# Patient Record
Sex: Male | Born: 1995 | Race: Black or African American | Hispanic: No | Marital: Single | Smoking: Current every day smoker
Health system: Southern US, Community
[De-identification: ages and names within clinical notes are randomized; demographics above are authoritative.]

---

## 2013-02-09 ENCOUNTER — Encounter (HOSPITAL_BASED_OUTPATIENT_CLINIC_OR_DEPARTMENT_OTHER): Payer: Self-pay | Admitting: *Deleted

## 2013-02-09 ENCOUNTER — Emergency Department (HOSPITAL_BASED_OUTPATIENT_CLINIC_OR_DEPARTMENT_OTHER)
Admission: EM | Admit: 2013-02-09 | Discharge: 2013-02-09 | Disposition: A | Discharge status: Home / Self Care | Payer: Managed Care, Other (non HMO) | Attending: Emergency Medicine | Admitting: Emergency Medicine

## 2013-02-09 DIAGNOSIS — H5710 Ocular pain, unspecified eye: Secondary | ICD-10-CM | POA: Insufficient documentation

## 2013-02-09 DIAGNOSIS — J3489 Other specified disorders of nose and nasal sinuses: Secondary | ICD-10-CM | POA: Insufficient documentation

## 2013-02-09 DIAGNOSIS — H579 Unspecified disorder of eye and adnexa: Secondary | ICD-10-CM | POA: Insufficient documentation

## 2013-02-09 DIAGNOSIS — R6889 Other general symptoms and signs: Secondary | ICD-10-CM | POA: Insufficient documentation

## 2013-02-09 DIAGNOSIS — H109 Unspecified conjunctivitis: Secondary | ICD-10-CM

## 2013-02-09 DIAGNOSIS — H53149 Visual discomfort, unspecified: Secondary | ICD-10-CM | POA: Insufficient documentation

## 2013-02-09 DIAGNOSIS — F17200 Nicotine dependence, unspecified, uncomplicated: Secondary | ICD-10-CM | POA: Insufficient documentation

## 2013-02-09 MED ORDER — FLUORESCEIN SODIUM 1 MG OP STRP
ORAL_STRIP | OPHTHALMIC | Status: AC
  Administered 2013-02-09: 18:00:00
  Filled 2013-02-09: qty 1

## 2013-02-09 MED ORDER — ERYTHROMYCIN 5 MG/GM OP OINT: TOPICAL_OINTMENT | Freq: Two times a day (BID) | OPHTHALMIC | Status: DC

## 2013-02-09 MED ORDER — TETRACAINE HCL 0.5 % OP SOLN
OPHTHALMIC | Status: AC
  Administered 2013-02-09: 18:00:00
  Filled 2013-02-09: qty 2

## 2013-02-09 NOTE — ED Notes (Signed)
Left eye is light sensitive, red, tearing and painful. Started 2 days ago.

## 2013-02-09 NOTE — ED Provider Notes (Signed)
Patient seen/examined in the Emergency Department in conjunction with Resident Physician Provider [facility] Patient reports left eye redness/pain Exam : OS conjunctival injection, +consensual pain but no cell/flare, IOP less than 20.  No foreign body/abrasions noted Plan: f/u with ophtho.  Possible early iritis but visual acuity normal without much photophobia noted   Joya Gaskins, MD 02/09/13 1850

## 2013-02-09 NOTE — ED Provider Notes (Signed)
I have personally seen and examined the patient.  I have discussed the plan of care with the resident.  I have reviewed the documentation on PMH/FH/Soc. History.  I have reviewed the documentation of the resident and agree.   Joya Gaskins, MD 02/09/13 2214

## 2013-02-09 NOTE — ED Provider Notes (Signed)
History     CSN: [id number]  Arrival date & time 02/09/13  4098   First MD Initiated Contact with Patient 02/09/13 1737      Chief Complaint  Patient presents with  . Eye Problem    (Consider location/radiation/quality/duration/timing/severity/associated sxs/prior treatment) Patient is a 17 y.o. male presenting with eye problem.  Eye Problem Associated symptoms: itching, photophobia and redness   Associated symptoms: no discharge and no nausea     17 year old male presents with left eye redness, photophobia, and pain since yesterday, was acutely worse today.  He says he does not wear contacts or glasses, has not scratched his eye that he knows of.  He says when he left work yesterday afternoon the sun really hurt his left eye.  He says it seems like there is a film over his eye but denies spots in his vision.  He says that even when the left eye is covered if he sees bright light with the right the left hurts.    He does have allergic rhinitis and gets red itchy eyes during the spring time, but it is usually both eyes and the pain has never been this bad before.   History reviewed. No pertinent past medical history.  History reviewed. No pertinent past surgical history.  No family history on file.  History  Substance Use Topics  . Smoking status: Current Every Day Smoker -- 0.50 packs/day    Types: Cigarettes  . Smokeless tobacco: Not on file  . Alcohol Use: Yes      Review of Systems  Constitutional: Negative for fever.  HENT: Positive for rhinorrhea and sneezing.   Eyes: Positive for photophobia, pain, redness and itching. Negative for discharge and visual disturbance.  Respiratory: Negative for shortness of breath.   Cardiovascular: Negative for chest pain.  Gastrointestinal: Negative for nausea.  Endocrine: Negative for polyuria.  Genitourinary: Negative for dysuria.  Musculoskeletal: Negative for myalgias.  Skin: Negative for rash.  Neurological: Negative for  light-headedness.    Allergies  Review of patient's allergies indicates no known allergies.  Home Medications  No current outpatient prescriptions on file.  BP 120/64  Pulse 60  Temp(Src) 98.6 F (37 C) (Oral)  Resp 16  Ht 5\' 11"  (1.803 m)  Wt 205 lb (92.987 kg)  BMI 28.6 kg/m2  SpO2 100%  Physical Exam  Constitutional: He appears well-developed and well-nourished.  Eyes: Right eye exhibits no discharge. Left eye exhibits no discharge, no exudate and no hordeolum. No foreign body present in the left eye. Right conjunctiva is not injected. Left conjunctiva is injected.  Slit lamp exam:      The right eye shows no corneal abrasion and no corneal flare.       The left eye shows no corneal abrasion and no corneal flare.    ED Course  Procedures (including critical care time)  Labs Reviewed - No data to display No results found.   1. Conjunctivitis       MDM  No sign of corneal abrasion or iritis on exam today.  rx for erythromycin and advised to f/u with opthamology if not improved in 1-2 days.         Ardyth Gal, MD 02/09/13 1842  Ardyth Gal, MD 02/09/13 2136

## 2014-04-10 ENCOUNTER — Encounter (HOSPITAL_COMMUNITY): Payer: Self-pay | Admitting: Emergency Medicine

## 2014-04-10 ENCOUNTER — Emergency Department (HOSPITAL_COMMUNITY): Payer: Managed Care, Other (non HMO)

## 2014-04-10 ENCOUNTER — Emergency Department (HOSPITAL_COMMUNITY)
Admission: EM | Admit: 2014-04-10 | Discharge: 2014-04-10 | Disposition: A | Discharge status: Home / Self Care | Payer: Managed Care, Other (non HMO) | Attending: Emergency Medicine | Admitting: Emergency Medicine

## 2014-04-10 DIAGNOSIS — S0120XA Unspecified open wound of nose, initial encounter: Secondary | ICD-10-CM | POA: Insufficient documentation

## 2014-04-10 DIAGNOSIS — S0012XA Contusion of left eyelid and periocular area, initial encounter: Secondary | ICD-10-CM

## 2014-04-10 DIAGNOSIS — S0121XA Laceration without foreign body of nose, initial encounter: Secondary | ICD-10-CM

## 2014-04-10 DIAGNOSIS — S0010XA Contusion of unspecified eyelid and periocular area, initial encounter: Secondary | ICD-10-CM | POA: Insufficient documentation

## 2014-04-10 DIAGNOSIS — S022XXA Fracture of nasal bones, initial encounter for closed fracture: Secondary | ICD-10-CM | POA: Insufficient documentation

## 2014-04-10 DIAGNOSIS — Z23 Encounter for immunization: Secondary | ICD-10-CM | POA: Insufficient documentation

## 2014-04-10 DIAGNOSIS — F17200 Nicotine dependence, unspecified, uncomplicated: Secondary | ICD-10-CM | POA: Insufficient documentation

## 2014-04-10 MED ORDER — HYDROCODONE-ACETAMINOPHEN 5-325 MG PO TABS
2.0000 | ORAL_TABLET | Freq: Once | ORAL | Status: AC
  Administered 2014-04-10: 2 via ORAL
  Filled 2014-04-10: qty 2

## 2014-04-10 MED ORDER — HYDROCODONE-ACETAMINOPHEN 5-325 MG PO TABS: 1.0000 | ORAL_TABLET | ORAL | Status: DC | PRN

## 2014-04-10 MED ORDER — FLUORESCEIN SODIUM 1 MG OP STRP
1.0000 | ORAL_STRIP | Freq: Once | OPHTHALMIC | Status: AC
  Administered 2014-04-10: 1 via OPHTHALMIC
  Filled 2014-04-10: qty 1

## 2014-04-10 MED ORDER — TETRACAINE HCL 0.5 % OP SOLN
1.0000 [drp] | Freq: Once | OPHTHALMIC | Status: AC
  Administered 2014-04-10: 1 [drp] via OPHTHALMIC
  Filled 2014-04-10: qty 2

## 2014-04-10 MED ORDER — TETANUS-DIPHTH-ACELL PERTUSSIS 5-2.5-18.5 LF-MCG/0.5 IM SUSP
0.5000 mL | Freq: Once | INTRAMUSCULAR | Status: DC
  Filled 2014-04-10: qty 0.5

## 2014-04-10 NOTE — Discharge Instructions (Signed)
Contusion °A contusion is a deep bruise. Contusions are the result of an injury that caused bleeding under the skin. The contusion may turn blue, purple, or yellow. Minor injuries will give you a painless contusion, but more severe contusions may stay painful and swollen for a few weeks.  °CAUSES  °A contusion is usually caused by a blow, trauma, or direct force to an area of the body. °SYMPTOMS  °· Swelling and redness of the injured area. °· Bruising of the injured area. °· Tenderness and soreness of the injured area. °· Pain. °DIAGNOSIS  °The diagnosis can be made by taking a history and physical exam. An X-ray, CT scan, or MRI may be needed to determine if there were any associated injuries, such as fractures. °TREATMENT  °Specific treatment will depend on what area of the body was injured. In general, the best treatment for a contusion is resting, icing, elevating, and applying cold compresses to the injured area. Over-the-counter medicines may also be recommended for pain control. Ask your caregiver what the best treatment is for your contusion. °HOME CARE INSTRUCTIONS  °· Put ice on the injured area. °· Put ice in a plastic bag. °· Place a towel between your skin and the bag. °· Leave the ice on for 15-20 minutes, 3-4 times a day, or as directed by your health care provider. °· Only take over-the-counter or prescription medicines for pain, discomfort, or fever as directed by your caregiver. Your caregiver may recommend avoiding anti-inflammatory medicines (aspirin, ibuprofen, and naproxen) for 48 hours because these medicines may increase bruising. °· Rest the injured area. °· If possible, elevate the injured area to reduce swelling. °SEEK IMMEDIATE MEDICAL CARE IF:  °· You have increased bruising or swelling. °· You have pain that is getting worse. °· Your swelling or pain is not relieved with medicines. °MAKE SURE YOU:  °· Understand these instructions. °· Will watch your condition. °· Will get help right  away if you are not doing well or get worse. °Document Released: 07/10/2005 Document Revised: 10/05/2013 Document Reviewed: 08/05/2011 °Exit[employer]® Patient Information ©2015 [employer]. This information is not intended to replace advice given to you by your health care provider. Make sure you discuss any questions you have with your health care provider. ° °Laceration Care, Adult °A laceration is a cut or lesion that goes through all layers of the skin and into the tissue just beneath the skin. °TREATMENT  °Some lacerations may not require closure. Some lacerations may not be able to be closed due to an increased risk of infection. It is important to see your caregiver as soon as possible after an injury to minimize the risk of infection and maximize the opportunity for successful closure. °If closure is appropriate, pain medicines may be given, if needed. The wound will be cleaned to help prevent infection. Your caregiver will use stitches (sutures), staples, wound glue (adhesive), or skin adhesive strips to repair the laceration. These tools bring the skin edges together to allow for faster healing and a better cosmetic outcome. However, all wounds will heal with a scar. Once the wound has healed, scarring can be minimized by covering the wound with sunscreen during the day for 1 full year. °HOME CARE INSTRUCTIONS  °For sutures or staples: °· Keep the wound clean and dry. °· If you were given a bandage (dressing), you should change it at least once a day. Also, change the dressing if it becomes wet or dirty, or as directed by your caregiver. °·   Wash the wound with soap and water 2 times a day. Rinse the wound off with water to remove all soap. Pat the wound dry with a clean towel. °· After cleaning, apply a thin layer of the antibiotic ointment as recommended by your caregiver. This will help prevent infection and keep the dressing from sticking. °· You may shower as usual after the first 24 hours. Do not soak  the wound in water until the sutures are removed. °· Only take over-the-counter or prescription medicines for pain, discomfort, or fever as directed by your caregiver. °· Get your sutures or staples removed as directed by your caregiver. °For skin adhesive strips: °· Keep the wound clean and dry. °· Do not get the skin adhesive strips wet. You may bathe carefully, using caution to keep the wound dry. °· If the wound gets wet, pat it dry with a clean towel. °· Skin adhesive strips will fall off on their own. You may trim the strips as the wound heals. Do not remove skin adhesive strips that are still stuck to the wound. They will fall off in time. °For wound adhesive: °· You may briefly wet your wound in the shower or bath. Do not soak or scrub the wound. Do not swim. Avoid periods of heavy perspiration until the skin adhesive has fallen off on its own. After showering or bathing, gently pat the wound dry with a clean towel. °· Do not apply liquid medicine, cream medicine, or ointment medicine to your wound while the skin adhesive is in place. This may loosen the film before your wound is healed. °· If a dressing is placed over the wound, be careful not to apply tape directly over the skin adhesive. This may cause the adhesive to be pulled off before the wound is healed. °· Avoid prolonged exposure to sunlight or tanning lamps while the skin adhesive is in place. Exposure to ultraviolet light in the first year will darken the scar. °· The skin adhesive will usually remain in place for 5 to 10 days, then naturally fall off the skin. Do not pick at the adhesive film. °You may need a tetanus shot if: °· You cannot remember when you had your last tetanus shot. °· You have never had a tetanus shot. °If you get a tetanus shot, your arm may swell, get red, and feel warm to the touch. This is common and not a problem. If you need a tetanus shot and you choose not to have one, there is a rare chance of getting tetanus.  Sickness from tetanus can be serious. °SEEK MEDICAL CARE IF:  °· You have redness, swelling, or increasing pain in the wound. °· You see a red line that goes away from the wound. °· You have yellowish-white fluid (pus) coming from the wound. °· You have a fever. °· You notice a bad smell coming from the wound or dressing. °· Your wound breaks open before or after sutures have been removed. °· You notice something coming out of the wound such as wood or glass. °· Your wound is on your hand or foot and you cannot move a finger or toe. °SEEK IMMEDIATE MEDICAL CARE IF:  °· Your pain is not controlled with prescribed medicine. °· You have severe swelling around the wound causing pain and numbness or a change in color in your arm, hand, leg, or foot. °· Your wound splits open and starts bleeding. °· You have worsening numbness, weakness, or loss of function of any   joint around or beyond the wound. °· You develop painful lumps near the wound or on the skin anywhere on your body. °MAKE SURE YOU:  °· Understand these instructions. °· Will watch your condition. °· Will get help right away if you are not doing well or get worse. °Document Released: 09/30/2005 Document Revised: 12/23/2011 Document Reviewed: 03/26/2011 °ExitCare® Patient Information ©2015 [employer]. This information is not intended to replace advice given to you by your health care provider. Make sure you discuss any questions you have with your health care provider. ° °
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
## 2014-04-11 NOTE — ED Provider Notes (Signed)
Medical screening examination/treatment/procedure(s) were performed by non-physician practitioner and as supervising physician I was immediately available for consultation/collaboration.   EKG Interpretation None        Enid SkeensJoshua M Zavitz, MD 04/11/14 [phone]

## 2019-02-27 ENCOUNTER — Emergency Department (HOSPITAL_COMMUNITY): Payer: 59

## 2019-02-27 ENCOUNTER — Emergency Department (HOSPITAL_COMMUNITY)
Admission: EM | Admit: 2019-02-27 | Discharge: 2019-02-27 | Disposition: A | Discharge status: Home / Self Care | Payer: 59 | Attending: Emergency Medicine | Admitting: Emergency Medicine

## 2019-02-27 ENCOUNTER — Encounter (HOSPITAL_COMMUNITY): Payer: Self-pay | Admitting: Emergency Medicine

## 2019-02-27 ENCOUNTER — Other Ambulatory Visit: Payer: Self-pay

## 2019-02-27 ENCOUNTER — Ambulatory Visit (HOSPITAL_COMMUNITY)
Admission: EM | Admit: 2019-02-27 | Discharge: 2019-02-27 | Disposition: A | Discharge status: Home / Self Care | Payer: 59 | Source: Home / Self Care | Attending: Family Medicine | Admitting: Family Medicine

## 2019-02-27 DIAGNOSIS — R112 Nausea with vomiting, unspecified: Secondary | ICD-10-CM | POA: Insufficient documentation

## 2019-02-27 DIAGNOSIS — F17210 Nicotine dependence, cigarettes, uncomplicated: Secondary | ICD-10-CM | POA: Diagnosis not present

## 2019-02-27 DIAGNOSIS — R1013 Epigastric pain: Secondary | ICD-10-CM | POA: Diagnosis not present

## 2019-02-27 LAB — CBC
HCT: 50.6 % (ref 39.0–52.0)
Hemoglobin: 17.7 g/dL — ABNORMAL HIGH (ref 13.0–17.0)
MCH: 31.2 pg (ref 26.0–34.0)
MCHC: 35 g/dL (ref 30.0–36.0)
MCV: 89.1 fL (ref 80.0–100.0)
Platelets: 320 10*3/uL (ref 150–400)
RBC: 5.68 MIL/uL (ref 4.22–5.81)
RDW: 12.6 % (ref 11.5–15.5)
WBC: 11.8 10*3/uL — ABNORMAL HIGH (ref 4.0–10.5)
nRBC: 0 % (ref 0.0–0.2)

## 2019-02-27 LAB — COMPREHENSIVE METABOLIC PANEL
ALT: 42 U/L (ref 0–44)
AST: 26 U/L (ref 15–41)
Albumin: 4.9 g/dL (ref 3.5–5.0)
Alkaline Phosphatase: 82 U/L (ref 38–126)
Anion gap: 14 (ref 5–15)
BUN: 8 mg/dL (ref 6–20)
CO2: 23 mmol/L (ref 22–32)
Calcium: 10.1 mg/dL (ref 8.9–10.3)
Chloride: 101 mmol/L (ref 98–111)
Creatinine, Ser: 1.17 mg/dL (ref 0.61–1.24)
GFR calc Af Amer: >60 mL/min (ref >60)
GFR calc non Af Amer: >60 mL/min (ref >60)
Glucose, Bld: 95 mg/dL (ref 70–99)
Potassium: 3.2 mmol/L — ABNORMAL LOW (ref 3.5–5.1)
Sodium: 138 mmol/L (ref 135–145)
Total Bilirubin: 1.2 mg/dL (ref 0.3–1.2)
Total Protein: 9.2 g/dL — ABNORMAL HIGH (ref 6.5–8.1)

## 2019-02-27 LAB — RAPID URINE DRUG SCREEN, HOSP PERFORMED
Amphetamines: NOT DETECTED
Barbiturates: NOT DETECTED
Benzodiazepines: NOT DETECTED
Cocaine: POSITIVE — AB
Opiates: NOT DETECTED
Tetrahydrocannabinol: POSITIVE — AB

## 2019-02-27 LAB — LIPASE, BLOOD: Lipase: 28 U/L (ref 11–51)

## 2019-02-27 MED ORDER — ONDANSETRON HCL 4 MG/2ML IJ SOLN
4.0000 mg | Freq: Once | INTRAMUSCULAR | Status: AC
  Administered 2019-02-27: 4 mg via INTRAVENOUS
  Filled 2019-02-27: qty 2

## 2019-02-27 MED ORDER — HYDROMORPHONE HCL 1 MG/ML IJ SOLN: 0.5000 mg | Freq: Once | INTRAMUSCULAR | Status: DC

## 2019-02-27 MED ORDER — ONDANSETRON 4 MG PO TBDP: 4.0000 mg | ORAL_TABLET | Freq: Three times a day (TID) | ORAL | 0 refills | Status: DC | PRN

## 2019-02-27 MED ORDER — SODIUM CHLORIDE 0.9 % IV BOLUS
1000.0000 mL | Freq: Once | INTRAVENOUS | Status: AC
  Administered 2019-02-27: 1000 mL via INTRAVENOUS

## 2019-02-27 MED ORDER — LORAZEPAM 2 MG/ML IJ SOLN: 1.0000 mg | Freq: Once | INTRAMUSCULAR | Status: DC

## 2019-02-27 MED ORDER — POTASSIUM CHLORIDE CRYS ER 20 MEQ PO TBCR
40.0000 meq | EXTENDED_RELEASE_TABLET | Freq: Once | ORAL | Status: AC
  Administered 2019-02-27: 40 meq via ORAL
  Filled 2019-02-27: qty 2

## 2019-02-27 MED ORDER — FAMOTIDINE IN NACL 20-0.9 MG/50ML-% IV SOLN
20.0000 mg | Freq: Once | INTRAVENOUS | Status: AC
  Administered 2019-02-27: 20 mg via INTRAVENOUS
  Filled 2019-02-27: qty 50

## 2019-02-27 NOTE — ED Notes (Signed)
Pt obreathing fast and c/o drawing hands. Pt cautioned too slow breathing. Same reported to Dr. Denton Lank.

## 2019-02-27 NOTE — ED Notes (Addendum)
Pt resting much mnore comfortabluy and sleeping at intervals. Will inform provider. Pt sleeping at intervals and states pain improved.

## 2019-02-27 NOTE — ED Triage Notes (Signed)
Pt arrives via POV with reports of emesis since yesterday. Denies any diarrhea or abd pain.

## 2019-02-27 NOTE — ED Provider Notes (Signed)
  Physical Exam  BP 131/71 (BP Location: Right Arm)   Pulse 72   Temp 99 F (37.2 C) (Oral)   Resp (!) 21   Ht 5\' 11"  (1.803 m)   Wt 108.9 kg   SpO2 100%   BMI 33.47 kg/m   Physical Exam  ED Course/Procedures     Procedures  MDM  Received care of patient from Dr. Ethelle Lyon at 3 PM.  Please see his note for prior history and physical exam.  Briefly this is a 23 year old male who presents with concern for nausea and vomiting beginning yesterday.  On my history, he reports vomiting without abdominal pain, no diarrhea, no dysuria, but does report shortness of breath, chills, headache and sore throat.  He has no sick contacts.  CXR ordered and negative.  Reports sore throat from vomiting on reeval.   Abdominal exam benign, tolerating po, well appearing.   Discussed suspect viral gastroenteritis, effect of marijuana and also consider COVID19 given constellation of symptoms.  He declines testing but reports he will quarantine given the potential clinical concern.  Given rx for zofran. Patient discharged in stable condition with understanding of reasons to return.     Alvira Monday, MD 03/01/19 1436

## 2019-02-27 NOTE — Discharge Instructions (Addendum)
It was our pleasure to provide your ER care today - we hope that you feel better.  Drink plenty of fluids.   Follow up with primary care doctor Monday if symptoms fail to improve/resolve.  Return to ER if worse, new symptoms, fevers, new or severe pain, persistent vomiting, other concern.

## 2019-02-27 NOTE — ED Notes (Signed)
Pt aware of need for urine sample, urinal at bedside 
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
## 2019-02-27 NOTE — ED Notes (Signed)
Reminded pt of need for urine sample, given privacy to attempt to provide

## 2019-02-27 NOTE — ED Provider Notes (Signed)
[facility] EMERGENCY DEPARTMENT Provider Note   CSN: [id number] Arrival date & time: 02/27/19  1322    History   Chief Complaint Chief Complaint  Patient presents with  . Emesis    HPI Calogero Boose is a 23 y.o. male.     Patient c/o nausea and vomiting onset last pm. Several episodes emesis - clear or color of ingested fluids, not bloody or bilious. Epigastric pain, dull, cramping, intermittent, moderate, non radiating. No hx same symptoms. Had normal bm today. No abd distension. Denies back or flank pain. No hx pud, pancreatitis or gallstones. No known bad food ingestion or ill contacts. No fever or chills. Denies chest pain or discomfort. No cough or uri symptoms. No dysuria or gu c/o.   The history is provided by the patient.  Emesis  Associated symptoms: abdominal pain   Associated symptoms: no cough, no fever, no headaches and no sore throat     History reviewed. No pertinent past medical history.  There are no active problems to display for this patient.   History reviewed. No pertinent surgical history.      Home Medications    Prior to Admission medications   Medication Sig Start Date End Date Taking? Authorizing Provider  HYDROcodone-acetaminophen (NORCO/VICODIN) 5-325 MG per tablet Take 1 tablet by mouth every 4 (four) hours as needed for moderate pain or severe pain. 04/10/14   Antony Madura, PA-C    Family History No family history on file.  Social History Social History   Tobacco Use  . Smoking status: Current Every Day Smoker    Packs/day: 0.50    Types: Cigarettes  Substance Use Topics  . Alcohol use: Yes  . Drug use: No     Allergies   Patient has no known allergies.   Review of Systems Review of Systems  Constitutional: Negative for fever.  HENT: Negative for sore throat.   Eyes: Negative for redness.  Respiratory: Negative for cough and shortness of breath.   Cardiovascular: Negative for chest pain.   Gastrointestinal: Positive for abdominal pain, nausea and vomiting. Negative for abdominal distention and constipation.  Genitourinary: Negative for dysuria and flank pain.  Musculoskeletal: Negative for back pain and neck pain.  Skin: Negative for rash.  Neurological: Negative for headaches.  Hematological: Does not bruise/bleed easily.  Psychiatric/Behavioral: Negative for confusion.     Physical Exam Updated Vital Signs BP 131/71 (BP Location: Right Arm)   Pulse 72   Temp 99 F (37.2 C) (Oral)   Resp (!) 21   Ht 1.803 m (5\' 11" )   Wt 108.9 kg   SpO2 100%   BMI 33.47 kg/m   Physical Exam Vitals signs and nursing note reviewed.  Constitutional:      Appearance: Normal appearance. He is well-developed.  HENT:     Head: Atraumatic.     Nose: Nose normal.     Mouth/Throat:     Mouth: Mucous membranes are moist.     Pharynx: Oropharynx is clear.  Eyes:     General: No scleral icterus.    Conjunctiva/sclera: Conjunctivae normal.  Neck:     Musculoskeletal: Normal range of motion and neck supple. No neck rigidity.     Trachea: No tracheal deviation.  Cardiovascular:     Rate and Rhythm: Normal rate and regular rhythm.     Pulses: Normal pulses.     Heart sounds: Normal heart sounds. No murmur. No friction rub. No gallop.   Pulmonary:  Effort: Pulmonary effort is normal. No accessory muscle usage or respiratory distress.     Breath sounds: Normal breath sounds.  Abdominal:     General: Bowel sounds are normal. There is no distension.     Palpations: Abdomen is soft. There is no mass.     Tenderness: There is no abdominal tenderness. There is no guarding or rebound.     Hernia: No hernia is present.  Genitourinary:    Comments: No cva tenderness. Musculoskeletal:        General: No swelling.     Right lower leg: No edema.     Left lower leg: No edema.  Skin:    General: Skin is warm and dry.     Findings: No rash.  Neurological:     Mental Status: He is  alert.     Comments: Alert, speech clear.   Psychiatric:        Mood and Affect: Mood normal.      ED Treatments / Results  Labs (all labs ordered are listed, but only abnormal results are displayed) Results for orders placed or performed during the hospital encounter of 02/27/19  CBC  Result Value Ref Range   WBC 11.8 (H) 4.0 - 10.5 K/uL   RBC 5.68 4.22 - 5.81 MIL/uL   Hemoglobin 17.7 (H) 13.0 - 17.0 g/dL   HCT 16.150.6 09.639.0 - 04.552.0 %   MCV 89.1 80.0 - 100.0 fL   MCH 31.2 26.0 - 34.0 pg   MCHC 35.0 30.0 - 36.0 g/dL   RDW 40.912.6 81.111.5 - 91.415.5 %   Platelets 320 150 - 400 K/uL   nRBC 0.0 0.0 - 0.2 %   EKG None  Radiology No results found.  Procedures Procedures (including critical care time)  Medications Ordered in ED Medications  sodium chloride 0.9 % bolus 1,000 mL (has no administration in time range)  ondansetron (ZOFRAN) injection 4 mg (has no administration in time range)  famotidine (PEPCID) IVPB 20 mg premix (has no administration in time range)     Initial Impression / Assessment and Plan / ED Course  I have reviewed the triage vital signs and the nursing notes.  Pertinent labs & imaging results that were available during my care of the patient were reviewed by me and considered in my medical decision making (see chart for details).  Iv ns bolus. zofran iv. pepcid iv. Labs sent.   Reviewed nursing notes and prior charts for additional history.   Labs reviewed by me - wbc 11. hgb 17. Additional ivf.   Pt was anxious appearing - meds ordered, but on recheck is calm and comfortable appearing.  Awaiting additional lab results and ivf.   1530 signed out to Dr Dalene SeltzerSchlossman to check labs, ivf, and to reassess and dispo appropriately.     Final Clinical Impressions(s) / ED Diagnoses   Final diagnoses:  None    ED Discharge Orders    None       Cathren LaineSteinl, Amauri Medellin, MD 02/27/19 1534

## 2019-09-13 ENCOUNTER — Emergency Department (HOSPITAL_COMMUNITY)
Admission: EM | Admit: 2019-09-13 | Discharge: 2019-09-13 | Disposition: A | Discharge status: Home / Self Care | Payer: 59 | Attending: Emergency Medicine | Admitting: Emergency Medicine

## 2019-09-13 ENCOUNTER — Encounter (HOSPITAL_COMMUNITY): Payer: Self-pay | Admitting: Emergency Medicine

## 2019-09-13 ENCOUNTER — Other Ambulatory Visit: Payer: Self-pay

## 2019-09-13 DIAGNOSIS — R112 Nausea with vomiting, unspecified: Secondary | ICD-10-CM | POA: Insufficient documentation

## 2019-09-13 DIAGNOSIS — Z5321 Procedure and treatment not carried out due to patient leaving prior to being seen by health care provider: Secondary | ICD-10-CM | POA: Insufficient documentation

## 2019-09-13 LAB — COMPREHENSIVE METABOLIC PANEL
ALT: 57 U/L — ABNORMAL HIGH (ref 0–44)
AST: 37 U/L (ref 15–41)
Albumin: 4.6 g/dL (ref 3.5–5.0)
Alkaline Phosphatase: 65 U/L (ref 38–126)
Anion gap: 18 — ABNORMAL HIGH (ref 5–15)
BUN: 19 mg/dL (ref 6–20)
CO2: 18 mmol/L — ABNORMAL LOW (ref 22–32)
Calcium: 9.6 mg/dL (ref 8.9–10.3)
Chloride: 97 mmol/L — ABNORMAL LOW (ref 98–111)
Creatinine, Ser: 0.95 mg/dL (ref 0.61–1.24)
GFR calc Af Amer: >60 mL/min (ref >60)
GFR calc non Af Amer: >60 mL/min (ref >60)
Glucose, Bld: 90 mg/dL (ref 70–99)
Potassium: 2.8 mmol/L — ABNORMAL LOW (ref 3.5–5.1)
Sodium: 133 mmol/L — ABNORMAL LOW (ref 135–145)
Total Bilirubin: 2.6 mg/dL — ABNORMAL HIGH (ref 0.3–1.2)
Total Protein: 9.1 g/dL — ABNORMAL HIGH (ref 6.5–8.1)

## 2019-09-13 LAB — CBC
HCT: 52 % (ref 39.0–52.0)
Hemoglobin: 18.6 g/dL — ABNORMAL HIGH (ref 13.0–17.0)
MCH: 31 pg (ref 26.0–34.0)
MCHC: 35.8 g/dL (ref 30.0–36.0)
MCV: 86.7 fL (ref 80.0–100.0)
Platelets: 280 10*3/uL (ref 150–400)
RBC: 6 MIL/uL — ABNORMAL HIGH (ref 4.22–5.81)
RDW: 12 % (ref 11.5–15.5)
WBC: 6.7 10*3/uL (ref 4.0–10.5)
nRBC: 0 % (ref 0.0–0.2)

## 2019-09-13 LAB — LIPASE, BLOOD: Lipase: 26 U/L (ref 11–51)

## 2019-09-13 MED ORDER — SODIUM CHLORIDE 0.9% FLUSH: 3.0000 mL | Freq: Once | INTRAVENOUS | Status: DC

## 2019-09-13 MED ORDER — ONDANSETRON 4 MG PO TBDP
4.0000 mg | ORAL_TABLET | Freq: Once | ORAL | Status: AC | PRN
  Administered 2019-09-13: 4 mg via ORAL
  Filled 2019-09-13: qty 1

## 2019-09-13 NOTE — ED Notes (Signed)
Pt seen leaving the lobby Called pt, No response

## 2019-09-13 NOTE — ED Notes (Signed)
Pt denies vomiting since nausea med given but still verbalizing nausea.

## 2019-09-13 NOTE — ED Notes (Signed)
Asked Pt for urine and he stated he was still unable to go

## 2019-09-13 NOTE — ED Triage Notes (Signed)
Pt complaint of n/v for 4 days; denies diarrhea; denies pain just verbalizes "my stomach is weak."

## 2019-09-14 ENCOUNTER — Emergency Department (HOSPITAL_BASED_OUTPATIENT_CLINIC_OR_DEPARTMENT_OTHER): Payer: 59

## 2019-09-14 ENCOUNTER — Other Ambulatory Visit: Payer: Self-pay

## 2019-09-14 ENCOUNTER — Encounter (HOSPITAL_BASED_OUTPATIENT_CLINIC_OR_DEPARTMENT_OTHER): Payer: Self-pay | Admitting: *Deleted

## 2019-09-14 ENCOUNTER — Emergency Department (HOSPITAL_BASED_OUTPATIENT_CLINIC_OR_DEPARTMENT_OTHER)
Admission: EM | Admit: 2019-09-14 | Discharge: 2019-09-14 | Disposition: A | Discharge status: Home / Self Care | Payer: 59 | Attending: Emergency Medicine | Admitting: Emergency Medicine

## 2019-09-14 DIAGNOSIS — R112 Nausea with vomiting, unspecified: Secondary | ICD-10-CM

## 2019-09-14 DIAGNOSIS — F17210 Nicotine dependence, cigarettes, uncomplicated: Secondary | ICD-10-CM | POA: Diagnosis not present

## 2019-09-14 DIAGNOSIS — R109 Unspecified abdominal pain: Secondary | ICD-10-CM | POA: Diagnosis not present

## 2019-09-14 DIAGNOSIS — R0602 Shortness of breath: Secondary | ICD-10-CM | POA: Diagnosis not present

## 2019-09-14 DIAGNOSIS — E876 Hypokalemia: Secondary | ICD-10-CM | POA: Insufficient documentation

## 2019-09-14 DIAGNOSIS — R0789 Other chest pain: Secondary | ICD-10-CM | POA: Diagnosis not present

## 2019-09-14 DIAGNOSIS — R05 Cough: Secondary | ICD-10-CM | POA: Diagnosis not present

## 2019-09-14 LAB — BASIC METABOLIC PANEL
Anion gap: 14 (ref 5–15)
BUN: 22 mg/dL — ABNORMAL HIGH (ref 6–20)
CO2: 22 mmol/L (ref 22–32)
Calcium: 9.3 mg/dL (ref 8.9–10.3)
Chloride: 95 mmol/L — ABNORMAL LOW (ref 98–111)
Creatinine, Ser: 1.13 mg/dL (ref 0.61–1.24)
GFR calc Af Amer: >60 mL/min (ref >60)
GFR calc non Af Amer: >60 mL/min (ref >60)
Glucose, Bld: 98 mg/dL (ref 70–99)
Potassium: 2.8 mmol/L — ABNORMAL LOW (ref 3.5–5.1)
Sodium: 131 mmol/L — ABNORMAL LOW (ref 135–145)

## 2019-09-14 LAB — CBC WITH DIFFERENTIAL/PLATELET
Abs Immature Granulocytes: 0.02 10*3/uL (ref 0.00–0.07)
Basophils Absolute: 0 10*3/uL (ref 0.0–0.1)
Basophils Relative: 0 %
Eosinophils Absolute: 0.1 10*3/uL (ref 0.0–0.5)
Eosinophils Relative: 2 %
HCT: 49.6 % (ref 39.0–52.0)
Hemoglobin: 18.2 g/dL — ABNORMAL HIGH (ref 13.0–17.0)
Immature Granulocytes: 0 %
Lymphocytes Relative: 22 %
Lymphs Abs: 1.6 10*3/uL (ref 0.7–4.0)
MCH: 31.2 pg (ref 26.0–34.0)
MCHC: 36.7 g/dL — ABNORMAL HIGH (ref 30.0–36.0)
MCV: 84.9 fL (ref 80.0–100.0)
Monocytes Absolute: 1 10*3/uL (ref 0.1–1.0)
Monocytes Relative: 15 %
Neutro Abs: 4.3 10*3/uL (ref 1.7–7.7)
Neutrophils Relative %: 61 %
Platelets: 293 10*3/uL (ref 150–400)
RBC: 5.84 MIL/uL — ABNORMAL HIGH (ref 4.22–5.81)
RDW: 11.8 % (ref 11.5–15.5)
WBC: 7.1 10*3/uL (ref 4.0–10.5)
nRBC: 0 % (ref 0.0–0.2)

## 2019-09-14 LAB — MAGNESIUM: Magnesium: 2.2 mg/dL (ref 1.7–2.4)

## 2019-09-14 LAB — CK: Total CK: 310 U/L (ref 49–397)

## 2019-09-14 MED ORDER — METOCLOPRAMIDE HCL 10 MG PO TABS: 10.0000 mg | ORAL_TABLET | Freq: Four times a day (QID) | ORAL | 0 refills | Status: DC

## 2019-09-14 MED ORDER — SODIUM CHLORIDE 0.9 % IV BOLUS
1000.0000 mL | Freq: Once | INTRAVENOUS | Status: AC
  Administered 2019-09-14: 1000 mL via INTRAVENOUS

## 2019-09-14 MED ORDER — POTASSIUM CHLORIDE CRYS ER 20 MEQ PO TBCR
40.0000 meq | EXTENDED_RELEASE_TABLET | Freq: Once | ORAL | Status: AC
  Administered 2019-09-14: 40 meq via ORAL
  Filled 2019-09-14: qty 2

## 2019-09-14 MED ORDER — POTASSIUM CHLORIDE 10 MEQ/100ML IV SOLN
10.0000 meq | INTRAVENOUS | Status: AC
  Administered 2019-09-14 (×2): 10 meq via INTRAVENOUS
  Filled 2019-09-14 (×2): qty 100

## 2019-09-14 MED ORDER — POTASSIUM CHLORIDE ER 10 MEQ PO TBCR: 10.0000 meq | EXTENDED_RELEASE_TABLET | Freq: Every day | ORAL | 0 refills | Status: DC

## 2019-09-14 MED ORDER — METOCLOPRAMIDE HCL 5 MG/ML IJ SOLN
10.0000 mg | Freq: Once | INTRAMUSCULAR | Status: AC
  Administered 2019-09-14: 10 mg via INTRAVENOUS
  Filled 2019-09-14: qty 2

## 2019-09-14 MED ORDER — ONDANSETRON HCL 4 MG/2ML IJ SOLN
4.0000 mg | Freq: Once | INTRAMUSCULAR | Status: AC
  Administered 2019-09-14: 4 mg via INTRAVENOUS
  Filled 2019-09-14: qty 2

## 2019-09-14 NOTE — ED Triage Notes (Addendum)
Nausea and vomiting for 5 days.  Has not been eating for 5 days.  Severe muscle spasms.

## 2019-09-14 NOTE — ED Notes (Signed)
ED Provider at bedside. 

## 2019-09-14 NOTE — Discharge Instructions (Addendum)
Take Reglan every 6 hours as needed for nausea or vomiting.  Begin with clear liquid diet and progress diet as tolerated with bland foods, such as bananas, rice, applesauce, toast, crackers.  Avoid greasy, fried, fatty, acidic foods initially.  Please follow-up with your doctor for further evaluation of these recurrent episodes.  You may need referral to a GI doctor.  Please try to stop using marijuana, as it may be causing the symptoms.  Please talk to your primary care doctor about a sleep study, as you were found to drop your oxygen saturations while sleeping during your visit today.  Please return to the emergency department if you develop any new or worsening symptoms including severe, localized abdominal pain, intractable vomiting, or any other concerning symptoms.

## 2019-09-14 NOTE — ED Notes (Signed)
Desat while sleeping, placed on 2l/m [place] SpO2 100% now.

## 2019-09-14 NOTE — ED Notes (Signed)
Patient denies smoking marijuana when asked during triage.  He also stated that he was drinking 2 days ago and the last time he smoked was 2 days ago.

## 2019-09-14 NOTE — ED Provider Notes (Signed)
[facility] EMERGENCY DEPARTMENT Provider Note   CSN: [id number] Arrival date & time: 09/14/19  [id number]     History   Chief Complaint Chief Complaint  Patient presents with  . Nausea  . vomiting    HPI Christopher Richardson is a 23 y.o. male who presents with a 5-day history of nausea and vomiting.  He has had associated abdominal pain that he describes as a tightness and emptiness.  He denies any diarrhea or bloody stools, fevers.  He has had some cough.  He reports having chest pain, shortness of breath, and severe muscle cramping on arrival, but after 1 L of fluid prior to my evaluation, patient feels much improved.  He reports drinking about 1/5 of alcohol every few days.  He smokes marijuana daily.  Patient denies any known sick contacts.  He reports he was tested recently for Covid and was negative     HPI  History reviewed. No pertinent past medical history.  There are no active problems to display for this patient.   History reviewed. No pertinent surgical history.      Home Medications    Prior to Admission medications   Medication Sig Start Date End Date Taking? Authorizing Provider  metoCLOPramide (REGLAN) 10 MG tablet Take 1 tablet (10 mg total) by mouth every 6 (six) hours. 09/14/19   Kymberli Wiegand, Waylan BogaAlexandra M, PA-C  ondansetron (ZOFRAN ODT) 4 MG disintegrating tablet Take 1 tablet (4 mg total) by mouth every 8 (eight) hours as needed for nausea or vomiting. 02/27/19   Alvira MondaySchlossman, Erin, MD  potassium chloride (KLOR-CON) 10 MEQ tablet Take 1 tablet (10 mEq total) by mouth daily. 09/14/19   Emi HolesLaw, Kebra Lowrimore M, PA-C    Family History History reviewed. No pertinent family history.  Social History Social History   Tobacco Use  . Smoking status: Current Every Day Smoker    Packs/day: 0.50    Types: Cigarettes  . Smokeless tobacco: Never Used  Substance Use Topics  . Alcohol use: Yes    Comment: occasionally  . Drug use: Yes    Types: Marijuana     Allergies    Patient has no known allergies.   Review of Systems Review of Systems  Constitutional: Negative for chills and fever.  HENT: Negative for facial swelling and sore throat.   Respiratory: Positive for cough and shortness of breath.   Cardiovascular: Positive for chest pain.  Gastrointestinal: Positive for abdominal pain, nausea and vomiting. Negative for blood in stool and diarrhea.  Genitourinary: Positive for decreased urine volume. Negative for dysuria.  Musculoskeletal: Positive for myalgias. Negative for back pain.  Skin: Negative for rash and wound.  Neurological: Negative for headaches.  Psychiatric/Behavioral: The patient is not nervous/anxious.      Physical Exam Updated Vital Signs BP 128/63   Pulse 79   Temp 97.6 F (36.4 C) (Oral)   Resp 16   SpO2 96%   Physical Exam Vitals signs and nursing note reviewed.  Constitutional:      General: He is not in acute distress.    Appearance: He is well-developed. He is not diaphoretic.  HENT:     Head: Normocephalic and atraumatic.     Mouth/Throat:     Mouth: Mucous membranes are dry.     Pharynx: No oropharyngeal exudate.  Eyes:     General: No scleral icterus.       Right eye: No discharge.        Left eye: No discharge.  Conjunctiva/sclera: Conjunctivae normal.     Pupils: Pupils are equal, round, and reactive to light.  Neck:     Musculoskeletal: Normal range of motion and neck supple.     Thyroid: No thyromegaly.  Cardiovascular:     Rate and Rhythm: Normal rate and regular rhythm.     Heart sounds: Normal heart sounds. No murmur. No friction rub. No gallop.   Pulmonary:     Effort: Pulmonary effort is normal. No respiratory distress.     Breath sounds: Normal breath sounds. No stridor. No wheezing or rales.  Abdominal:     General: Bowel sounds are normal. There is no distension.     Palpations: Abdomen is soft.     Tenderness: There is abdominal tenderness (mild) in the epigastric area. There is no  guarding or rebound.  Lymphadenopathy:     Cervical: No cervical adenopathy.  Skin:    General: Skin is warm and dry.     Coloration: Skin is not pale.     Findings: No rash.  Neurological:     Mental Status: He is alert.     Coordination: Coordination normal.      ED Treatments / Results  Labs (all labs ordered are listed, but only abnormal results are displayed) Labs Reviewed  BASIC METABOLIC PANEL - Abnormal; Notable for the following components:      Result Value   Sodium 131 (*)    Potassium 2.8 (*)    Chloride 95 (*)    BUN 22 (*)    All other components within normal limits  CBC WITH DIFFERENTIAL/PLATELET - Abnormal; Notable for the following components:   RBC 5.84 (*)    Hemoglobin 18.2 (*)    MCHC 36.7 (*)    All other components within normal limits  CK  MAGNESIUM    EKG None  Radiology Dg Chest Portable 1 View  Result Date: 09/14/2019 CLINICAL DATA:  Cough and nausea and vomiting for several days, initial encounter EXAM: PORTABLE CHEST 1 VIEW COMPARISON:  02/27/2019 FINDINGS: The heart size and mediastinal contours are within normal limits. Both lungs are clear. The visualized skeletal structures are unremarkable. IMPRESSION: No active disease. Electronically Signed   By: Inez Catalina M.D.   On: 09/14/2019 09:38    Procedures Procedures (including critical care time)  Medications Ordered in ED Medications  sodium chloride 0.9 % bolus 1,000 mL (0 mLs Intravenous Stopped 09/14/19 1052)  potassium chloride 10 mEq in 100 mL IVPB ( Intravenous Stopped 09/14/19 1228)  ondansetron (ZOFRAN) injection 4 mg (4 mg Intravenous Given 09/14/19 1058)  potassium chloride SA (KLOR-CON) CR tablet 40 mEq (40 mEq Oral Given 09/14/19 1208)  metoCLOPramide (REGLAN) injection 10 mg (10 mg Intravenous Given 09/14/19 1227)     Initial Impression / Assessment and Plan / ED Course  I have reviewed the triage vital signs and the nursing notes.  Pertinent labs & imaging results  that were available during my care of the patient were reviewed by me and considered in my medical decision making (see chart for details).        Patient presenting with nausea and vomiting.  Abdominal exam is benign, nonfocal.  Patient found to be mildly dehydrated and hypokalemic which was replaced in the ED and will discharge home with 5 more days of 10 mEq.  Otherwise labs are reassuring.  Patient is tolerating oral fluids.  Counseled on possibility of cannabis hyperemesis and cessation.  Will refer to PCP for further work-up of recurrent  episodes, as patient was seen 6 months ago for similar symptoms.  Patient found to be hypoxic a few times while sleeping.  His sister is at bedside who notes patient's sleeping habits and noting him to stop breathing at times.  Patient counseled to see PCP for sleep study for ultimate and probable CPAP machine.  Patient is oxygenating 95-100% on room air while awake.  Patient feeling much better after fluids, no longer having spasms noted on arrival.  Progressive diet discussed.  Return precautions discussed.  Patient understands and agrees with plan.  Patient vitals stable throughout ED course and discharged in satisfactory condition.  Final Clinical Impressions(s) / ED Diagnoses   Final diagnoses:  Non-intractable vomiting with nausea, unspecified vomiting type  Hypokalemia    ED Discharge Orders         Ordered    metoCLOPramide (REGLAN) 10 MG tablet  Every 6 hours     09/14/19 1326    potassium chloride (KLOR-CON) 10 MEQ tablet  Daily     09/14/19 [address], [place] 09/14/19 1332    Sabas Sous, MD 09/15/19 [phone]

## 2019-09-14 NOTE — ED Notes (Signed)
Smoked marijuana 2 days ago.

## 2019-09-15 ENCOUNTER — Telehealth: Payer: 59

## 2019-09-15 ENCOUNTER — Other Ambulatory Visit: Payer: Self-pay

## 2020-08-08 ENCOUNTER — Other Ambulatory Visit: Payer: Self-pay

## 2020-08-08 ENCOUNTER — Emergency Department (HOSPITAL_COMMUNITY)
Admission: EM | Admit: 2020-08-08 | Discharge: 2020-08-09 | Disposition: A | Discharge status: Home / Self Care | Payer: 59 | Attending: Emergency Medicine | Admitting: Emergency Medicine

## 2020-08-08 ENCOUNTER — Encounter (HOSPITAL_COMMUNITY): Payer: Self-pay | Admitting: Emergency Medicine

## 2020-08-08 DIAGNOSIS — Z711 Person with feared health complaint in whom no diagnosis is made: Secondary | ICD-10-CM

## 2020-08-08 DIAGNOSIS — F17210 Nicotine dependence, cigarettes, uncomplicated: Secondary | ICD-10-CM | POA: Insufficient documentation

## 2020-08-08 DIAGNOSIS — Z202 Contact with and (suspected) exposure to infections with a predominantly sexual mode of transmission: Secondary | ICD-10-CM | POA: Insufficient documentation

## 2020-08-08 NOTE — ED Triage Notes (Signed)
Patient requesting STD screening , denies symptoms , no penile discharge or dysuria .

## 2020-08-09 ENCOUNTER — Encounter (HOSPITAL_COMMUNITY): Payer: Self-pay | Admitting: Emergency Medicine

## 2020-08-09 NOTE — ED Provider Notes (Signed)
[facility] EMERGENCY DEPARTMENT Provider Note   CSN: [id number] Arrival date & time: 08/08/20  2256     History Chief Complaint  Patient presents with  . Exposure to STD    Tori Cupps is a 24 y.o. male.  The history is provided by the patient.  Exposure to STD This is a new problem. The problem occurs constantly. The problem has not changed since onset.Pertinent negatives include no chest pain, no abdominal pain, no headaches and no shortness of breath. Nothing aggravates the symptoms. Nothing relieves the symptoms. He has tried nothing for the symptoms. The treatment provided no relief.  Patient is not having any symptoms.  No known specific exposures.  Just wants to be tested.  No f/c/r.  No n/v/d.       History reviewed. No pertinent past medical history.  There are no problems to display for this patient.   History reviewed. No pertinent surgical history.     History reviewed. No pertinent family history.  Social History   Tobacco Use  . Smoking status: Current Every Day Smoker    Packs/day: 0.50    Types: Cigarettes  . Smokeless tobacco: Never Used  Substance Use Topics  . Alcohol use: Yes    Comment: occasionally  . Drug use: Yes    Types: Marijuana    Home Medications Prior to Admission medications   Medication Sig Start Date End Date Taking? Authorizing Provider  metoCLOPramide (REGLAN) 10 MG tablet Take 1 tablet (10 mg total) by mouth every 6 (six) hours. 09/14/19   Law, Waylan Boga, PA-C  ondansetron (ZOFRAN ODT) 4 MG disintegrating tablet Take 1 tablet (4 mg total) by mouth every 8 (eight) hours as needed for nausea or vomiting. 02/27/19   Alvira Monday, MD  potassium chloride (KLOR-CON) 10 MEQ tablet Take 1 tablet (10 mEq total) by mouth daily. 09/14/19   Emi Holes, PA-C    Allergies    Patient has no known allergies.  Review of Systems   Review of Systems  Constitutional: Negative for fever.  HENT: Negative for  congestion.   Eyes: Negative for visual disturbance.  Respiratory: Negative for shortness of breath.   Cardiovascular: Negative for chest pain.  Gastrointestinal: Negative for abdominal pain.  Genitourinary: Negative for difficulty urinating.  Musculoskeletal: Negative for arthralgias.  Skin: Negative for rash.  Neurological: Negative for headaches.  Psychiatric/Behavioral: Negative for agitation.  All other systems reviewed and are negative.   Physical Exam Updated Vital Signs BP (!) 141/73 (BP Location: Right Arm)   Pulse 85   Temp 98.2 F (36.8 C) (Oral)   Resp 16   SpO2 97%   Physical Exam Vitals and nursing note reviewed.  Constitutional:      General: He is not in acute distress.    Appearance: Normal appearance.  HENT:     Head: Normocephalic and atraumatic.     Nose: Nose normal.  Eyes:     Extraocular Movements: Extraocular movements intact.  Cardiovascular:     Rate and Rhythm: Normal rate and regular rhythm.     Pulses: Normal pulses.     Heart sounds: Normal heart sounds.  Pulmonary:     Effort: Pulmonary effort is normal.     Breath sounds: Normal breath sounds.  Abdominal:     General: Abdomen is flat. Bowel sounds are normal.     Palpations: Abdomen is soft.     Tenderness: There is no abdominal tenderness.  Musculoskeletal:  General: Normal range of motion.     Cervical back: Normal range of motion and neck supple.  Skin:    General: Skin is warm and dry.     Capillary Refill: Capillary refill takes less than 2 seconds.  Neurological:     General: No focal deficit present.     Mental Status: He is alert and oriented to person, place, and time.  Psychiatric:        Mood and Affect: Mood normal.        Behavior: Behavior normal.     ED Results / Procedures / Treatments   Labs (all labs ordered are listed, but only abnormal results are displayed) Labs Reviewed  GC/CHLAMYDIA PROBE AMP ([place]) NOT AT [facility]     EKG None  Radiology No results found.  Procedures Procedures (including critical care time)  Medications Ordered in ED Medications - No data to display  ED Course  I have reviewed the triage vital signs and the nursing notes.  Pertinent labs & imaging results that were available during my care of the patient were reviewed by me and considered in my medical decision making (see chart for details).    Follow up with your PMD or the county health department.  Use condoms at all times.    Makaveli Hoard was evaluated in Emergency Department on 08/09/2020 for the symptoms described in the history of present illness. He was evaluated in the context of the global COVID-19 pandemic, which necessitated consideration that the patient might be at risk for infection with the SARS-CoV-2 virus that causes COVID-19. Institutional protocols and algorithms that pertain to the evaluation of patients at risk for COVID-19 are in a state of rapid change based on information released by regulatory bodies including the CDC and federal and state organizations. These policies and algorithms were followed during the patient's care in the ED.  Final Clinical Impression(s) / ED Diagnoses  Return for intractable cough, coughing up blood,fevers >100.4 unrelieved by medication, shortness of breath, intractable vomiting, chest pain, shortness of breath, weakness,numbness, changes in speech, facial asymmetry,abdominal pain, passing out,Inability to tolerate liquids or food, cough, altered mental status or any concerns. No signs of systemic illness or infection. The patient is nontoxic-appearing on exam and vital signs are within normal limits.   I have reviewed the triage vital signs and the nursing notes. Pertinent labs &imaging results that were available during my care of the patient were reviewed by me and considered in my medical decision making (see chart for details).After history, exam, and  medical workup I feel the patient has beenappropriately medically screened and is safe for discharge home. Pertinent diagnoses were discussed with the patient. Patient was given return precautions.   Zareya Tuckett, MD 08/09/20 6767

## 2020-12-11 ENCOUNTER — Other Ambulatory Visit: Payer: Self-pay

## 2020-12-11 ENCOUNTER — Encounter (HOSPITAL_COMMUNITY): Payer: Self-pay | Admitting: *Deleted

## 2020-12-11 ENCOUNTER — Emergency Department (HOSPITAL_COMMUNITY)
Admission: EM | Admit: 2020-12-11 | Discharge: 2020-12-11 | Disposition: A | Discharge status: Home / Self Care | Payer: Self-pay | Attending: Emergency Medicine | Admitting: Emergency Medicine

## 2020-12-11 DIAGNOSIS — R451 Restlessness and agitation: Secondary | ICD-10-CM | POA: Insufficient documentation

## 2020-12-11 DIAGNOSIS — R112 Nausea with vomiting, unspecified: Secondary | ICD-10-CM | POA: Insufficient documentation

## 2020-12-11 DIAGNOSIS — Z5321 Procedure and treatment not carried out due to patient leaving prior to being seen by health care provider: Secondary | ICD-10-CM | POA: Insufficient documentation

## 2020-12-11 LAB — COMPREHENSIVE METABOLIC PANEL
ALT: 63 U/L — ABNORMAL HIGH (ref 0–44)
AST: 35 U/L (ref 15–41)
Albumin: 5 g/dL (ref 3.5–5.0)
Alkaline Phosphatase: 71 U/L (ref 38–126)
Anion gap: 15 (ref 5–15)
BUN: 11 mg/dL (ref 6–20)
CO2: 16 mmol/L — ABNORMAL LOW (ref 22–32)
Calcium: 9.9 mg/dL (ref 8.9–10.3)
Chloride: 109 mmol/L (ref 98–111)
Creatinine, Ser: 1.01 mg/dL (ref 0.61–1.24)
GFR, Estimated: >60 mL/min (ref >60)
Glucose, Bld: 133 mg/dL — ABNORMAL HIGH (ref 70–99)
Potassium: 4.7 mmol/L (ref 3.5–5.1)
Sodium: 140 mmol/L (ref 135–145)
Total Bilirubin: 1.1 mg/dL (ref 0.3–1.2)
Total Protein: 10.1 g/dL — ABNORMAL HIGH (ref 6.5–8.1)

## 2020-12-11 LAB — LIPASE, BLOOD: Lipase: 28 U/L (ref 11–51)

## 2020-12-11 LAB — CBC
HCT: 50.2 % (ref 39.0–52.0)
Hemoglobin: 17.6 g/dL — ABNORMAL HIGH (ref 13.0–17.0)
MCH: 31 pg (ref 26.0–34.0)
MCHC: 35.1 g/dL (ref 30.0–36.0)
MCV: 88.5 fL (ref 80.0–100.0)
Platelets: 284 10*3/uL (ref 150–400)
RBC: 5.67 MIL/uL (ref 4.22–5.81)
RDW: 12.4 % (ref 11.5–15.5)
WBC: 11 10*3/uL — ABNORMAL HIGH (ref 4.0–10.5)
nRBC: 0 % (ref 0.0–0.2)

## 2020-12-11 NOTE — ED Notes (Signed)
Called 3x for room placement. Eloped from waiting area.  

## 2020-12-11 NOTE — ED Triage Notes (Signed)
BIB EMS developed nausea and spitting up since yesterday. States there is sick child in house with same that has improved. Pt agitated and noncompliant in triage. 100%-26-CBG 132-62-124/68 # 20 L AC 500 ml NS with 4 mg Zofran

## 2020-12-11 NOTE — ED Notes (Signed)
PT PLACED IN LOBBY, IV REMOVED AFTER HE KEPT MAKING COMMENTS ABOUT LEAVING

## 2021-03-08 ENCOUNTER — Emergency Department (HOSPITAL_BASED_OUTPATIENT_CLINIC_OR_DEPARTMENT_OTHER)
Admission: EM | Admit: 2021-03-08 | Discharge: 2021-03-08 | Disposition: A | Discharge status: Home / Self Care | Payer: Self-pay | Attending: Emergency Medicine | Admitting: Emergency Medicine

## 2021-03-08 ENCOUNTER — Encounter (HOSPITAL_BASED_OUTPATIENT_CLINIC_OR_DEPARTMENT_OTHER): Payer: Self-pay

## 2021-03-08 ENCOUNTER — Other Ambulatory Visit: Payer: Self-pay

## 2021-03-08 DIAGNOSIS — Z202 Contact with and (suspected) exposure to infections with a predominantly sexual mode of transmission: Secondary | ICD-10-CM | POA: Insufficient documentation

## 2021-03-08 DIAGNOSIS — F17210 Nicotine dependence, cigarettes, uncomplicated: Secondary | ICD-10-CM | POA: Insufficient documentation

## 2021-03-08 LAB — URINALYSIS, ROUTINE W REFLEX MICROSCOPIC
Bilirubin Urine: NEGATIVE
Glucose, UA: NEGATIVE mg/dL
Hgb urine dipstick: NEGATIVE
Ketones, ur: NEGATIVE mg/dL
Leukocytes,Ua: NEGATIVE
Nitrite: NEGATIVE
Protein, ur: NEGATIVE mg/dL
Specific Gravity, Urine: <1.005 — ABNORMAL LOW (ref 1.005–1.030)
pH: 6.5 (ref 5.0–8.0)

## 2021-03-08 LAB — HIV ANTIBODY (ROUTINE TESTING W REFLEX): HIV Screen 4th Generation wRfx: NONREACTIVE

## 2021-03-08 MED ORDER — METRONIDAZOLE 500 MG PO TABS
2000.0000 mg | ORAL_TABLET | Freq: Once | ORAL | Status: AC
  Administered 2021-03-08: 2000 mg via ORAL
  Filled 2021-03-08: qty 4

## 2021-03-08 NOTE — Discharge Instructions (Signed)
Your STD screen will take approximately 2 days to return.  If positive you will be called.  We have treated you with antibiotics for trichomonas.  This was a one-time dose.  Return for any worsening symptoms or follow-up with the health department.

## 2021-03-08 NOTE — ED Triage Notes (Signed)
Pt's SO tested pos for STD and pt would like to be tested and treated here today.  Denies any sxs.

## 2021-03-08 NOTE — ED Provider Notes (Signed)
[facility] EMERGENCY DEPARTMENT Provider Note   CSN: [id number] Arrival date & time: 03/08/21  1311     History Chief Complaint  Patient presents with  . Exposure to STD    Christopher Richardson is a 25 y.o. male with presents for evaluation of exposure to STD.  Significant other told him she was tested positive for trichomonas.  He denies any current complaints.  No rashes, penile discharge, dysuria, hematuria, pain with bowel movements.  No pain to scrotum.  Intermittently uses protection.  Denies additional aggravating or alleviating factors.  History obtained from patient and past medical records.  No interpreter used.  HPI     History reviewed. No pertinent past medical history.  There are no problems to display for this patient.   History reviewed. No pertinent surgical history.     History reviewed. No pertinent family history.  Social History   Tobacco Use  . Smoking status: Current Every Day Smoker    Packs/day: 0.50    Types: Cigarettes  . Smokeless tobacco: Never Used  Vaping Use  . Vaping Use: Never used  Substance Use Topics  . Alcohol use: Yes    Comment: occasionally  . Drug use: Yes    Types: Marijuana    Home Medications Prior to Admission medications   Medication Sig Start Date End Date Taking? Authorizing Provider  metoCLOPramide (REGLAN) 10 MG tablet Take 1 tablet (10 mg total) by mouth every 6 (six) hours. 09/14/19   Law, Waylan Boga, PA-C  ondansetron (ZOFRAN ODT) 4 MG disintegrating tablet Take 1 tablet (4 mg total) by mouth every 8 (eight) hours as needed for nausea or vomiting. 02/27/19   Alvira Monday, MD  potassium chloride (KLOR-CON) 10 MEQ tablet Take 1 tablet (10 mEq total) by mouth daily. 09/14/19   Emi Holes, PA-C    Allergies    Patient has no known allergies.  Review of Systems   Review of Systems  Constitutional: Negative.   HENT: Negative.   Respiratory: Negative.   Cardiovascular: Negative.    Gastrointestinal: Negative.   Genitourinary: Negative.   Musculoskeletal: Negative.   Skin: Negative.   Neurological: Negative.   All other systems reviewed and are negative.   Physical Exam Updated Vital Signs BP 127/80 (BP Location: Left Arm)   Pulse (!) 105   Temp 98.5 F (36.9 C) (Oral)   Resp 18   Ht 5\' 11"  (1.803 m)   Wt 95.7 kg   SpO2 98%   BMI 29.43 kg/m   Physical Exam Vitals and nursing note reviewed.  Constitutional:      General: He is not in acute distress.    Appearance: He is well-developed. He is not ill-appearing, toxic-appearing or diaphoretic.  HENT:     Head: Atraumatic.     Nose: Nose normal.  Eyes:     Pupils: Pupils are equal, round, and reactive to light.  Cardiovascular:     Rate and Rhythm: Normal rate and regular rhythm.     Pulses: Normal pulses.     Heart sounds: Normal heart sounds.  Pulmonary:     Effort: Pulmonary effort is normal. No respiratory distress.     Breath sounds: Normal breath sounds.  Abdominal:     General: Bowel sounds are normal. There is no distension.     Palpations: Abdomen is soft.     Tenderness: There is no abdominal tenderness. There is no guarding or rebound.  Genitourinary:    Comments: Declined Musculoskeletal:  General: Normal range of motion.     Cervical back: Normal range of motion and neck supple.  Skin:    General: Skin is warm and dry.  Neurological:     Mental Status: He is alert.     ED Results / Procedures / Treatments   Labs (all labs ordered are listed, but only abnormal results are displayed) Labs Reviewed  URINALYSIS, ROUTINE W REFLEX MICROSCOPIC - Abnormal; Notable for the following components:      Result Value   Specific Gravity, Urine <1.005 (*)    All other components within normal limits  RPR  HIV ANTIBODY (ROUTINE TESTING W REFLEX)  GC/CHLAMYDIA PROBE AMP ([place]) NOT AT [facility]    EKG None  Radiology No results found.  Procedures Procedures    Medications Ordered in ED Medications  metroNIDAZOLE (FLAGYL) tablet 2,000 mg (2,000 mg Oral Given 03/08/21 1352)    ED Course  I have reviewed the triage vital signs and the nursing notes.  Pertinent labs & imaging results that were available during my care of the patient were reviewed by me and considered in my medical decision making (see chart for details).   Patient here for evaluation of STD exposure.  He is afebrile, nonseptic, non-ill-appearing.  Currently asymptomatic.  Significant other tested positive for trichomonas.  UA here negative for infection.  Will treat with Flagyl.  Patient is afebrile without abdominal tenderness, abdominal pain or painful bowel movements to indicate prostatitis.Declined GU exam. Denies pain to testes or epididymis to suggest orchitis or epididymitis.  STD cultures obtained including HIV, syphilis, gonorrhea and chlamydia. Patient to be discharged with instructions to follow up with PCP. Discussed importance of using protection when sexually active. Pt understands that they have GC/Chlamydia cultures pending and that they will need to inform all sexual partners if results return positive.   The patient has been appropriately medically screened and/or stabilized in the ED. I have low suspicion for any other emergent medical condition which would require further screening, evaluation or treatment in the ED or require inpatient management.  Patient is hemodynamically stable and in no acute distress.  Patient able to ambulate in department prior to ED.  Evaluation does not show acute pathology that would require ongoing or additional emergent interventions while in the emergency department or further inpatient treatment.  I have discussed the diagnosis with the patient and answered all questions.  Pain is been managed while in the emergency department and patient has no further complaints prior to discharge.  Patient is comfortable with plan discussed in room and  is stable for discharge at this time.  I have discussed strict return precautions for returning to the emergency department.  Patient was encouraged to follow-up with PCP/specialist refer to at discharge.    MDM Rules/Calculators/A&P                           Final Clinical Impression(s) / ED Diagnoses Final diagnoses:  STD exposure    Rx / DC Orders ED Discharge Orders    None       Nazir Hacker A, PA-C 03/08/21 1401    Long, Arlyss Repress, MD 03/10/21 [phone]

## 2021-03-09 LAB — GC/CHLAMYDIA PROBE AMP (~~LOC~~) NOT AT ARMC
Chlamydia: NEGATIVE
Comment: NEGATIVE
Comment: NORMAL
Neisseria Gonorrhea: NEGATIVE

## 2021-03-09 LAB — RPR: RPR Ser Ql: NONREACTIVE

## 2021-04-27 ENCOUNTER — Encounter (HOSPITAL_BASED_OUTPATIENT_CLINIC_OR_DEPARTMENT_OTHER): Payer: Self-pay

## 2021-04-27 ENCOUNTER — Emergency Department (HOSPITAL_BASED_OUTPATIENT_CLINIC_OR_DEPARTMENT_OTHER): Payer: Self-pay

## 2021-04-27 ENCOUNTER — Emergency Department (HOSPITAL_BASED_OUTPATIENT_CLINIC_OR_DEPARTMENT_OTHER)
Admission: EM | Admit: 2021-04-27 | Discharge: 2021-04-27 | Disposition: A | Discharge status: Home / Self Care | Payer: Self-pay | Attending: Emergency Medicine | Admitting: Emergency Medicine

## 2021-04-27 ENCOUNTER — Other Ambulatory Visit: Payer: Self-pay

## 2021-04-27 DIAGNOSIS — R252 Cramp and spasm: Secondary | ICD-10-CM | POA: Insufficient documentation

## 2021-04-27 DIAGNOSIS — Z20822 Contact with and (suspected) exposure to covid-19: Secondary | ICD-10-CM | POA: Insufficient documentation

## 2021-04-27 DIAGNOSIS — F17210 Nicotine dependence, cigarettes, uncomplicated: Secondary | ICD-10-CM | POA: Insufficient documentation

## 2021-04-27 DIAGNOSIS — Z79899 Other long term (current) drug therapy: Secondary | ICD-10-CM | POA: Insufficient documentation

## 2021-04-27 DIAGNOSIS — M791 Myalgia, unspecified site: Secondary | ICD-10-CM | POA: Insufficient documentation

## 2021-04-27 DIAGNOSIS — R112 Nausea with vomiting, unspecified: Secondary | ICD-10-CM | POA: Insufficient documentation

## 2021-04-27 DIAGNOSIS — R1013 Epigastric pain: Secondary | ICD-10-CM | POA: Insufficient documentation

## 2021-04-27 LAB — COMPREHENSIVE METABOLIC PANEL
ALT: 35 U/L (ref 0–44)
AST: 40 U/L (ref 15–41)
Albumin: 5.5 g/dL — ABNORMAL HIGH (ref 3.5–5.0)
Alkaline Phosphatase: 76 U/L (ref 38–126)
Anion gap: 16 — ABNORMAL HIGH (ref 5–15)
BUN: 23 mg/dL — ABNORMAL HIGH (ref 6–20)
CO2: 23 mmol/L (ref 22–32)
Calcium: 10.4 mg/dL — ABNORMAL HIGH (ref 8.9–10.3)
Chloride: 101 mmol/L (ref 98–111)
Creatinine, Ser: 1.58 mg/dL — ABNORMAL HIGH (ref 0.61–1.24)
GFR, Estimated: >60 mL/min (ref >60)
Glucose, Bld: 95 mg/dL (ref 70–99)
Potassium: 4.1 mmol/L (ref 3.5–5.1)
Sodium: 140 mmol/L (ref 135–145)
Total Bilirubin: 1.7 mg/dL — ABNORMAL HIGH (ref 0.3–1.2)
Total Protein: 10.5 g/dL — ABNORMAL HIGH (ref 6.5–8.1)

## 2021-04-27 LAB — URINALYSIS, MICROSCOPIC (REFLEX)

## 2021-04-27 LAB — URINALYSIS, ROUTINE W REFLEX MICROSCOPIC
Glucose, UA: NEGATIVE mg/dL
Hgb urine dipstick: NEGATIVE
Ketones, ur: >80 mg/dL — AB
Nitrite: NEGATIVE
Protein, ur: 100 mg/dL — AB
Specific Gravity, Urine: 1.02 (ref 1.005–1.030)
pH: 6.5 (ref 5.0–8.0)

## 2021-04-27 LAB — RAPID URINE DRUG SCREEN, HOSP PERFORMED
Amphetamines: NOT DETECTED
Barbiturates: NOT DETECTED
Benzodiazepines: NOT DETECTED
Cocaine: POSITIVE — AB
Opiates: NOT DETECTED
Tetrahydrocannabinol: POSITIVE — AB

## 2021-04-27 LAB — CBC WITH DIFFERENTIAL/PLATELET
Abs Immature Granulocytes: 0.03 10*3/uL (ref 0.00–0.07)
Basophils Absolute: 0 10*3/uL (ref 0.0–0.1)
Basophils Relative: 0 %
Eosinophils Absolute: 0 10*3/uL (ref 0.0–0.5)
Eosinophils Relative: 0 %
HCT: 52.5 % — ABNORMAL HIGH (ref 39.0–52.0)
Hemoglobin: 18.6 g/dL — ABNORMAL HIGH (ref 13.0–17.0)
Immature Granulocytes: 0 %
Lymphocytes Relative: 14 %
Lymphs Abs: 2.1 10*3/uL (ref 0.7–4.0)
MCH: 31 pg (ref 26.0–34.0)
MCHC: 35.4 g/dL (ref 30.0–36.0)
MCV: 87.5 fL (ref 80.0–100.0)
Monocytes Absolute: 1.5 10*3/uL — ABNORMAL HIGH (ref 0.1–1.0)
Monocytes Relative: 10 %
Neutro Abs: 10.9 10*3/uL — ABNORMAL HIGH (ref 1.7–7.7)
Neutrophils Relative %: 76 %
Platelets: 363 10*3/uL (ref 150–400)
RBC: 6 MIL/uL — ABNORMAL HIGH (ref 4.22–5.81)
RDW: 13.3 % (ref 11.5–15.5)
WBC: 14.5 10*3/uL — ABNORMAL HIGH (ref 4.0–10.5)
nRBC: 0 % (ref 0.0–0.2)

## 2021-04-27 LAB — RESP PANEL BY RT-PCR (FLU A&B, COVID) ARPGX2
Influenza A by PCR: NEGATIVE
Influenza B by PCR: NEGATIVE
SARS Coronavirus 2 by RT PCR: NEGATIVE

## 2021-04-27 LAB — LIPASE, BLOOD: Lipase: 29 U/L (ref 11–51)

## 2021-04-27 LAB — MAGNESIUM: Magnesium: 2.2 mg/dL (ref 1.7–2.4)

## 2021-04-27 MED ORDER — ONDANSETRON 4 MG PO TBDP: ORAL_TABLET | ORAL | 0 refills | Status: DC

## 2021-04-27 MED ORDER — SODIUM CHLORIDE 0.9 % IV BOLUS
1000.0000 mL | Freq: Once | INTRAVENOUS | Status: AC
  Administered 2021-04-27: 1000 mL via INTRAVENOUS

## 2021-04-27 MED ORDER — METOCLOPRAMIDE HCL 5 MG/ML IJ SOLN: 10.0000 mg | Freq: Once | INTRAMUSCULAR | Status: DC

## 2021-04-27 MED ORDER — DROPERIDOL 2.5 MG/ML IJ SOLN: 1.2500 mg | Freq: Once | INTRAMUSCULAR | Status: DC

## 2021-04-27 MED ORDER — ONDANSETRON HCL 4 MG/2ML IJ SOLN
4.0000 mg | Freq: Once | INTRAMUSCULAR | Status: AC
  Administered 2021-04-27: 4 mg via INTRAVENOUS
  Filled 2021-04-27: qty 2

## 2021-04-27 NOTE — ED Notes (Signed)
Pt noted to have cramping of hands, exacerbated with BP cuff inflation, pt is positive for Chvostek's Sign and Trousseau's Sign, ED MD informed, labs obtained and to the lab STAT, 12 lead ECG obtained

## 2021-04-27 NOTE — ED Notes (Signed)
Patients sats 71%. Upon arrival to patients room, patient asleep with mouth wide open, very hard to wake up. Once patient awoke and encouraged to take some deep breaths his oxygen returned to 100%. Placed patient on 2 L nasal cannula. Will continue to monitor.

## 2021-04-27 NOTE — ED Triage Notes (Signed)
Pt c/o n/v after drinking too much alcohol 7/12-hyperventilating-encouraged to take slow deep breaths-to triage in w/c

## 2021-04-27 NOTE — ED Notes (Signed)
No vomiting noted; noted patient to be spitting water out into emesis bag at time of discharge.

## 2021-04-27 NOTE — Discharge Instructions (Signed)
Nausea and Vomiting  Hand washing: Wash your hands throughout the day, but especially before and after touching the face, using the restroom, sneezing, coughing, or touching surfaces that have been coughed or sneezed upon. Hydration: Symptoms will be intensified and complicated by dehydration. Dehydration can also extend the duration of symptoms. Drink plenty of fluids and get plenty of rest. You should be drinking at least half a liter of water an hour to stay hydrated. Electrolyte drinks (ex. Gatorade, Powerade, Pedialyte) are also encouraged. You should be drinking enough fluids to make your urine light yellow, almost clear. If this is not the case, you are not drinking enough water. Please note that some of the treatments indicated below will not be effective if you are not adequately hydrated. Diet: Please concentrate on hydration, however, you may introduce food slowly.  Start with a clear liquid diet, progressed to a full liquid diet, and then bland solids as you are able. Pain or fever: Ibuprofen, Naproxen, or Tylenol for pain or fever.  Nausea/vomiting: Use the ondansetron (generic for Zofran) for nausea or vomiting.  This medication may not prevent all vomiting or nausea, but can help facilitate better hydration. Things that can help with nausea/vomiting also include peppermint/menthol candies, vitamin B12, and ginger. Follow-up: Follow-up with a primary care provider on this matter.  Additionally, you should have repeat lab work done in the form of a CBC and BMP to assure abnormalities found today have resolved. Return: Return should you develop a fever, bloody diarrhea, increased abdominal pain, uncontrolled vomiting, or any other major concerns.  For prescription assistance, may try using prescription discount sites or apps, such as goodrx.com

## 2021-04-27 NOTE — ED Provider Notes (Signed)
[facility] EMERGENCY DEPARTMENT Provider Note   CSN: [id number] Arrival date & time: 04/27/21  1239     History Chief Complaint  Patient presents with   Vomiting    Christopher Richardson is a 26 y.o. male.  HPI     Christopher Richardson is a 25 y.o. male, patient with no pertinent past medical history, presenting to the ED with nausea and vomiting for the last 3 days.  Patient states 3 days ago he drank an excessive amount of alcohol.  Since then, he has been experiencing nausea and nonbloody nonbilious emesis.  He also endorses body aches and epigastric discomfort.  Yesterday, he began to experience cramping in his hands and feet. Denies any illicit drug use. Denies known fever, specific chest pain, shortness of breath, cough, diarrhea, or any other complaints.    History reviewed. No pertinent past medical history.  There are no problems to display for this patient.   History reviewed. No pertinent surgical history.     No family history on file.  Social History   Tobacco Use   Smoking status: Every Day    Packs/day: 0.50    Types: Cigarettes   Smokeless tobacco: Never  Vaping Use   Vaping Use: Never used  Substance Use Topics   Alcohol use: Yes    Comment: daily   Drug use: Yes    Types: Marijuana    Home Medications Prior to Admission medications   Medication Sig Start Date End Date Taking? Authorizing Provider  metoCLOPramide (REGLAN) 10 MG tablet Take 1 tablet (10 mg total) by mouth every 6 (six) hours. 09/14/19   Law, Waylan Boga, PA-C  ondansetron (ZOFRAN ODT) 4 MG disintegrating tablet Take 1 tablet (4 mg total) by mouth every 8 (eight) hours as needed for nausea or vomiting. 02/27/19   Alvira Monday, MD  potassium chloride (KLOR-CON) 10 MEQ tablet Take 1 tablet (10 mEq total) by mouth daily. 09/14/19   Emi Holes, PA-C    Allergies    Patient has no known allergies.  Review of Systems   Review of Systems  Constitutional:  Negative for  chills, diaphoresis and fever.  Respiratory:  Negative for cough and shortness of breath.   Cardiovascular:  Negative for chest pain.  Gastrointestinal:  Positive for abdominal pain, nausea and vomiting. Negative for blood in stool and diarrhea.  Musculoskeletal:  Positive for myalgias.  Neurological:  Negative for dizziness, seizures, syncope, weakness and numbness.  All other systems reviewed and are negative.  Physical Exam Updated Vital Signs BP (!) 119/95 (BP Location: Left Arm)   Pulse 84   Resp (!) 29   Ht 5\' 11"  (1.803 m)   Wt 108.9 kg   SpO2 96%   BMI 33.47 kg/m   Physical Exam Vitals and nursing note reviewed.  Constitutional:      General: He is in acute distress.     Appearance: He is well-developed. He is not diaphoretic.  HENT:     Head: Normocephalic and atraumatic.     Mouth/Throat:     Mouth: Mucous membranes are moist.     Pharynx: Oropharynx is clear.  Eyes:     Conjunctiva/sclera: Conjunctivae normal.  Cardiovascular:     Rate and Rhythm: Normal rate and regular rhythm.     Pulses: Normal pulses.          Radial pulses are 2+ on the right side and 2+ on the left side.       Posterior tibial  pulses are 2+ on the right side and 2+ on the left side.     Heart sounds: Normal heart sounds.     Comments: Tactile temperature in the extremities appropriate and equal bilaterally. Pulmonary:     Effort: Pulmonary effort is normal. No respiratory distress.     Breath sounds: Normal breath sounds.     Comments: Hyperventilation. Abdominal:     Palpations: Abdomen is soft.     Tenderness: There is no abdominal tenderness. There is no guarding.  Musculoskeletal:     Cervical back: Neck supple.     Right lower leg: No edema.     Left lower leg: No edema.     Comments: Patient constantly moving flexing and extending his joints and moving his muscles.  Restless in the bed. Carpals spasms.  Skin:    General: Skin is warm and dry.     Capillary Refill: Capillary  refill takes less than 2 seconds.  Neurological:     Mental Status: He is alert.  Psychiatric:        Mood and Affect: Mood and affect normal.        Speech: Speech normal.        Behavior: Behavior normal.    ED Results / Procedures / Treatments   Labs (all labs ordered are listed, but only abnormal results are displayed) Labs Reviewed  CBC WITH DIFFERENTIAL/PLATELET - Abnormal; Notable for the following components:      Result Value   WBC 14.5 (*)    RBC 6.00 (*)    Hemoglobin 18.6 (*)    HCT 52.5 (*)    Neutro Abs 10.9 (*)    Monocytes Absolute 1.5 (*)    All other components within normal limits  COMPREHENSIVE METABOLIC PANEL - Abnormal; Notable for the following components:   BUN 23 (*)    Creatinine, Ser 1.58 (*)    Calcium 10.4 (*)    Total Protein 10.5 (*)    Albumin 5.5 (*)    Total Bilirubin 1.7 (*)    Anion gap 16 (*)    All other components within normal limits  RAPID URINE DRUG SCREEN, HOSP PERFORMED - Abnormal; Notable for the following components:   Cocaine POSITIVE (*)    Tetrahydrocannabinol POSITIVE (*)    All other components within normal limits  URINALYSIS, ROUTINE W REFLEX MICROSCOPIC - Abnormal; Notable for the following components:   Color, Urine ORANGE (*)    Bilirubin Urine MODERATE (*)    Ketones, ur >80 (*)    Protein, ur 100 (*)    Leukocytes,Ua TRACE (*)    All other components within normal limits  RESP PANEL BY RT-PCR (FLU A&B, COVID) ARPGX2  MAGNESIUM  LIPASE, BLOOD  ETHANOL  URINALYSIS, MICROSCOPIC (REFLEX)    EKG EKG Interpretation  Date/Time:  Friday April 27 2021 13:54:41 EDT Ventricular Rate:  107 PR Interval:  130 QRS Duration: 77 QT Interval:  365 QTC Calculation: 487 R Axis:   80 Text Interpretation: Sinus tachycardia Biatrial enlargement Probable left ventricular hypertrophy Borderline prolonged QT interval No previous tracing Confirmed by Gwyneth Sprout ([id number]) on 04/27/2021 2:06:30 PM  Radiology DG Chest  Portable 1 View  Result Date: 04/27/2021 CLINICAL DATA:  Nausea and vomiting. EXAM: PORTABLE CHEST 1 VIEW COMPARISON:  Chest x-ray dated September 14, 2019. FINDINGS: The heart size and mediastinal contours are within normal limits. Both lungs are clear. The visualized skeletal structures are unremarkable. IMPRESSION: No active disease. Electronically Signed   By: Chrissie Noa  Howell Pringle M.D.   On: 04/27/2021 17:15    Procedures Procedures   Medications Ordered in ED Medications  droperidol (INAPSINE) 2.5 MG/ML injection 1.25 mg (has no administration in time range)  ondansetron (ZOFRAN) injection 4 mg (4 mg Intravenous Given 04/27/21 1355)  sodium chloride 0.9 % bolus 1,000 mL (0 mLs Intravenous Stopped 04/27/21 1455)  sodium chloride 0.9 % bolus 1,000 mL (0 mLs Intravenous Stopped 04/27/21 1826)    ED Course  I have reviewed the triage vital signs and the nursing notes.  Pertinent labs & imaging results that were available during my care of the patient were reviewed by me and considered in my medical decision making (see chart for details).    MDM Rules/Calculators/A&P                          Patient presents with complaint of vomiting over the last few days. Patient is nontoxic appearing, afebrile, not tachycardic, not tachypneic, not hypotensive, maintains excellent SPO2 on room air, and is in no apparent distress.   I have reviewed the patient's chart to obtain more information.   I reviewed and interpreted the patient's labs and radiological studies. When considering the patient's elevated creatinine and recent history of vomiting, I suspect dehydration causing hemoconcentration as the source for the patient's elevations in WBCs, hemoglobin, total bilirubin, etc. However, since these elevations exist, he will be advised to follow-up with a PCP for resampling.  Following Zofran and IV fluids, was much more calm and remained that way throughout the rest of his ED course. He slept for a long  while in the ED.  We woke the patient to do oral fluid challenge, however, patient vomited.  End of shift patient care handoff report given to Jodi Geralds, PA-C. Plan: Follow-up on patient after additional antiemetic.  Final Clinical Impression(s) / ED Diagnoses Final diagnoses:  None    Rx / DC Orders ED Discharge Orders     None        Concepcion Living 04/27/21 Camillia Herter, MD 04/29/21 2216

## 2021-04-27 NOTE — ED Notes (Signed)
ED Provider at bedside. 

## 2021-04-27 NOTE — ED Provider Notes (Signed)
  Care assumed from [facility], please see his note for full details, but in brief Christopher Richardson is a 25 y.o. male arrives with persistent nausea and vomiting over the past 3 days after drinking an excessive amount of alcohol.  Labs show hemoconcentration.  Patient had some recurrent vomiting after initial antiemetics but then was able to sleep for quite a while.  Plan: Reassess after p.o. challenge  BP 124/65   Pulse 84   Temp 98.4 F (36.9 C) (Oral)   Resp (!) 23   Ht 5\' 11"  (1.803 m)   Wt 108.9 kg   SpO2 96%   BMI 33.47 kg/m     ED Course/Procedures   Labs Reviewed  CBC WITH DIFFERENTIAL/PLATELET - Abnormal; Notable for the following components:      Result Value   WBC 14.5 (*)    RBC 6.00 (*)    Hemoglobin 18.6 (*)    HCT 52.5 (*)    Neutro Abs 10.9 (*)    Monocytes Absolute 1.5 (*)    All other components within normal limits  COMPREHENSIVE METABOLIC PANEL - Abnormal; Notable for the following components:   BUN 23 (*)    Creatinine, Ser 1.58 (*)    Calcium 10.4 (*)    Total Protein 10.5 (*)    Albumin 5.5 (*)    Total Bilirubin 1.7 (*)    Anion gap 16 (*)    All other components within normal limits  RAPID URINE DRUG SCREEN, HOSP PERFORMED - Abnormal; Notable for the following components:   Cocaine POSITIVE (*)    Tetrahydrocannabinol POSITIVE (*)    All other components within normal limits  URINALYSIS, ROUTINE W REFLEX MICROSCOPIC - Abnormal; Notable for the following components:   Color, Urine ORANGE (*)    Bilirubin Urine MODERATE (*)    Ketones, ur >80 (*)    Protein, ur 100 (*)    Leukocytes,Ua TRACE (*)    All other components within normal limits  URINALYSIS, MICROSCOPIC (REFLEX) - Abnormal; Notable for the following components:   Bacteria, UA MANY (*)    All other components within normal limits  RESP PANEL BY RT-PCR (FLU A&B, COVID) ARPGX2  MAGNESIUM  LIPASE, BLOOD  ETHANOL     Procedures  MDM   On reevaluation patient is awake and  alert, he has been able to tolerate water with no additional vomiting without requiring additional antiemetics.  Patient has received 2 L IV fluids, he is overall well-appearing and has normal vitals.  Will discharge home with Zofran, discussed emphasis on hydration and clear fluids over the next 24 hours and then can slowly advance diet.  He will need to follow-up for recheck of his labs to ensure they are improving with rehydration.  Return precautions provided.  Patient discharged home in good condition.      04/27/21 2144    [id number], MD 04/29/21 [phone]

## 2021-05-08 ENCOUNTER — Emergency Department (HOSPITAL_BASED_OUTPATIENT_CLINIC_OR_DEPARTMENT_OTHER): Payer: Self-pay

## 2021-05-08 ENCOUNTER — Other Ambulatory Visit: Payer: Self-pay

## 2021-05-08 ENCOUNTER — Emergency Department (HOSPITAL_BASED_OUTPATIENT_CLINIC_OR_DEPARTMENT_OTHER)
Admission: EM | Admit: 2021-05-08 | Discharge: 2021-05-08 | Disposition: A | Discharge status: Home / Self Care | Payer: Self-pay | Attending: Emergency Medicine | Admitting: Emergency Medicine

## 2021-05-08 ENCOUNTER — Encounter (HOSPITAL_BASED_OUTPATIENT_CLINIC_OR_DEPARTMENT_OTHER): Payer: Self-pay

## 2021-05-08 DIAGNOSIS — F17210 Nicotine dependence, cigarettes, uncomplicated: Secondary | ICD-10-CM | POA: Insufficient documentation

## 2021-05-08 DIAGNOSIS — R112 Nausea with vomiting, unspecified: Secondary | ICD-10-CM | POA: Insufficient documentation

## 2021-05-08 DIAGNOSIS — Z79899 Other long term (current) drug therapy: Secondary | ICD-10-CM | POA: Insufficient documentation

## 2021-05-08 DIAGNOSIS — R0789 Other chest pain: Secondary | ICD-10-CM

## 2021-05-08 DIAGNOSIS — R531 Weakness: Secondary | ICD-10-CM | POA: Insufficient documentation

## 2021-05-08 DIAGNOSIS — R1084 Generalized abdominal pain: Secondary | ICD-10-CM | POA: Insufficient documentation

## 2021-05-08 LAB — URINALYSIS, ROUTINE W REFLEX MICROSCOPIC
Glucose, UA: NEGATIVE mg/dL
Hgb urine dipstick: NEGATIVE
Ketones, ur: >80 mg/dL — AB
Leukocytes,Ua: NEGATIVE
Nitrite: NEGATIVE
Protein, ur: NEGATIVE mg/dL
Specific Gravity, Urine: <1.005 — ABNORMAL LOW (ref 1.005–1.030)
pH: 6.5 (ref 5.0–8.0)

## 2021-05-08 LAB — CBC
HCT: UNDETERMINED % (ref 39.0–52.0)
Hemoglobin: 17.9 g/dL — ABNORMAL HIGH (ref 13.0–17.0)
MCH: UNDETERMINED pg (ref 26.0–34.0)
MCV: UNDETERMINED fL (ref 80.0–100.0)
Platelets: 340 10*3/uL (ref 150–400)
RBC: UNDETERMINED MIL/uL (ref 4.22–5.81)
RDW: UNDETERMINED % (ref 11.5–15.5)
WBC: 6.8 10*3/uL (ref 4.0–10.5)
nRBC: UNDETERMINED % (ref 0.0–0.2)

## 2021-05-08 LAB — COMPREHENSIVE METABOLIC PANEL
ALT: 63 U/L — ABNORMAL HIGH (ref 0–44)
AST: 31 U/L (ref 15–41)
Albumin: 4.6 g/dL (ref 3.5–5.0)
Alkaline Phosphatase: 65 U/L (ref 38–126)
Anion gap: 18 — ABNORMAL HIGH (ref 5–15)
BUN: 12 mg/dL (ref 6–20)
CO2: 25 mmol/L (ref 22–32)
Calcium: 10.7 mg/dL — ABNORMAL HIGH (ref 8.9–10.3)
Chloride: 88 mmol/L — ABNORMAL LOW (ref 98–111)
Creatinine, Ser: 1.09 mg/dL (ref 0.61–1.24)
GFR, Estimated: >60 mL/min (ref >60)
Glucose, Bld: 102 mg/dL — ABNORMAL HIGH (ref 70–99)
Potassium: 3 mmol/L — ABNORMAL LOW (ref 3.5–5.1)
Sodium: 131 mmol/L — ABNORMAL LOW (ref 135–145)
Total Bilirubin: 1.3 mg/dL — ABNORMAL HIGH (ref 0.3–1.2)
Total Protein: 9 g/dL — ABNORMAL HIGH (ref 6.5–8.1)

## 2021-05-08 LAB — RAPID URINE DRUG SCREEN, HOSP PERFORMED
Amphetamines: NOT DETECTED
Barbiturates: NOT DETECTED
Benzodiazepines: NOT DETECTED
Cocaine: NOT DETECTED
Opiates: NOT DETECTED
Tetrahydrocannabinol: POSITIVE — AB

## 2021-05-08 LAB — LIPASE, BLOOD: Lipase: 36 U/L (ref 11–51)

## 2021-05-08 MED ORDER — FENTANYL CITRATE (PF) 100 MCG/2ML IJ SOLN
50.0000 ug | Freq: Once | INTRAMUSCULAR | Status: AC
  Administered 2021-05-08: 50 ug via INTRAVENOUS
  Filled 2021-05-08: qty 2

## 2021-05-08 MED ORDER — ONDANSETRON HCL 4 MG/2ML IJ SOLN
4.0000 mg | Freq: Once | INTRAMUSCULAR | Status: AC
  Administered 2021-05-08: 4 mg via INTRAVENOUS
  Filled 2021-05-08: qty 2

## 2021-05-08 MED ORDER — SODIUM CHLORIDE 0.9 % IV BOLUS
1000.0000 mL | Freq: Once | INTRAVENOUS | Status: AC
  Administered 2021-05-08: 1000 mL via INTRAVENOUS

## 2021-05-08 MED ORDER — ONDANSETRON 4 MG PO TBDP: 4.0000 mg | ORAL_TABLET | Freq: Three times a day (TID) | ORAL | 0 refills | Status: DC | PRN

## 2021-05-08 MED ORDER — SODIUM CHLORIDE 0.9 % IV BOLUS
500.0000 mL | Freq: Once | INTRAVENOUS | Status: AC
  Administered 2021-05-08: 500 mL via INTRAVENOUS

## 2021-05-08 MED ORDER — IOHEXOL 300 MG/ML  SOLN
100.0000 mL | Freq: Once | INTRAMUSCULAR | Status: AC | PRN
  Administered 2021-05-08: 100 mL via INTRAVENOUS

## 2021-05-08 NOTE — ED Provider Notes (Signed)
[facility] EMERGENCY DEPARTMENT Provider Note   CSN: [id number] Arrival date & time: 05/08/21  5885     History Chief Complaint  Patient presents with   Emesis   Weakness    Christopher Richardson is a 25 y.o. male presents for evaluation of nausea/vomiting, generalized weakness that has been going on since 04/25/2021.  He was seen here on 7/15 for the same.  He states that they gave him some nausea medication which she has run out of.  He does not know why he keeps vomiting.  He states he has not been able to eat anything.  He states that he has every time he tries eat something, he will vomit it back up.  Sometimes this is immediate sometimes that is about 30 minutes afterwards.  He states sometimes the emesis is green, sometimes it is brown, sometimes it is bright red.  He does not know if there has been any blood in it.  He states he has not had a bowel movement in 2 weeks.  He states that since he has been vomiting so much, his chest is sore.  He has not noted any fever/chills.  He denies any dysuria, hematuria.  He has not followed up after his ER visit.  He does endorse smoking marijuana but states he has not smoked since his symptoms started.  He reports that he occasionally smokes 1 to 2 cigarettes a day.  He can reports he drinks about 1 alcoholic drink per week.  The history is provided by the patient.      History reviewed. No pertinent past medical history.  There are no problems to display for this patient.   History reviewed. No pertinent surgical history.     History reviewed. No pertinent family history.  Social History   Tobacco Use   Smoking status: Every Day    Packs/day: 0.50    Types: Cigarettes   Smokeless tobacco: Never  Vaping Use   Vaping Use: Never used  Substance Use Topics   Alcohol use: Yes    Comment: daily   Drug use: Yes    Types: Marijuana    Comment: hasnt smoke since 7/13    Home Medications Prior to Admission medications    Medication Sig Start Date End Date Taking? Authorizing Provider  ondansetron (ZOFRAN ODT) 4 MG disintegrating tablet Take 1 tablet (4 mg total) by mouth every 8 (eight) hours as needed for nausea or vomiting. 05/08/21  Yes Maxwell Caul, PA-C  metoCLOPramide (REGLAN) 10 MG tablet Take 1 tablet (10 mg total) by mouth every 6 (six) hours. 09/14/19   Law, Waylan Boga, PA-C  potassium chloride (KLOR-CON) 10 MEQ tablet Take 1 tablet (10 mEq total) by mouth daily. 09/14/19   Emi Holes, PA-C    Allergies    Patient has no known allergies.  Review of Systems   Review of Systems  Constitutional:  Negative for fever.  Respiratory:  Negative for cough and shortness of breath.   Cardiovascular:  Positive for chest pain.  Gastrointestinal:  Positive for abdominal pain, constipation, nausea and vomiting.  Genitourinary:  Negative for dysuria and hematuria.  Neurological:  Negative for headaches.  All other systems reviewed and are negative.  Physical Exam Updated Vital Signs BP (!) 142/72   Pulse 89   Temp 98.4 F (36.9 C) (Oral)   Resp (!) 23   Ht 5\' 11"  (1.803 m)   Wt 108.9 kg   SpO2 100%   BMI 33.47  kg/m   Physical Exam Vitals and nursing note reviewed.  Constitutional:      Appearance: Normal appearance. He is well-developed.  HENT:     Head: Normocephalic and atraumatic.  Eyes:     General: Lids are normal.     Conjunctiva/sclera: Conjunctivae normal.     Pupils: Pupils are equal, round, and reactive to light.  Cardiovascular:     Rate and Rhythm: Normal rate and regular rhythm.     Pulses: Normal pulses.     Heart sounds: Normal heart sounds. No murmur heard.   No friction rub. No gallop.  Pulmonary:     Effort: Pulmonary effort is normal.     Breath sounds: Normal breath sounds.     Comments: Lungs clear to auscultation bilaterally.  Symmetric chest rise.  No wheezing, rales, rhonchi. Chest:     Comments: No crepitus noted.  Abdominal:     Palpations: Abdomen  is soft. Abdomen is not rigid.     Tenderness: There is generalized abdominal tenderness. There is no guarding.     Comments: Abdomen soft, nondistended.  Diffuse generalized tenderness.  No rigidity, guarding.  Musculoskeletal:        General: Normal range of motion.     Cervical back: Full passive range of motion without pain.  Skin:    General: Skin is warm and dry.     Capillary Refill: Capillary refill takes less than 2 seconds.  Neurological:     Mental Status: He is alert and oriented to person, place, and time.  Psychiatric:        Speech: Speech normal.      ED Results / Procedures / Treatments   Labs (all labs ordered are listed, but only abnormal results are displayed) Labs Reviewed  COMPREHENSIVE METABOLIC PANEL - Abnormal; Notable for the following components:      Result Value   Sodium 131 (*)    Potassium 3.0 (*)    Chloride 88 (*)    Glucose, Bld 102 (*)    Calcium 10.7 (*)    Total Protein 9.0 (*)    ALT 63 (*)    Total Bilirubin 1.3 (*)    Anion gap 18 (*)    All other components within normal limits  CBC - Abnormal; Notable for the following components:   Hemoglobin 17.9 (*)    All other components within normal limits  URINALYSIS, ROUTINE W REFLEX MICROSCOPIC - Abnormal; Notable for the following components:   Specific Gravity, Urine <1.005 (*)    Bilirubin Urine SMALL (*)    Ketones, ur >80 (*)    All other components within normal limits  RAPID URINE DRUG SCREEN, HOSP PERFORMED - Abnormal; Notable for the following components:   Tetrahydrocannabinol POSITIVE (*)    All other components within normal limits  LIPASE, BLOOD    EKG None  Radiology CT ABDOMEN PELVIS W CONTRAST  Result Date: 05/08/2021 CLINICAL DATA:  Abdominal pain, acute, nonlocalized.  Vomiting. EXAM: CT ABDOMEN AND PELVIS WITH CONTRAST TECHNIQUE: Multidetector CT imaging of the abdomen and pelvis was performed using the standard protocol following bolus administration of  intravenous contrast. CONTRAST:  100mL OMNIPAQUE IOHEXOL 300 MG/ML  SOLN COMPARISON:  None. FINDINGS: Lower chest: Lung bases are clear. No effusions. Heart is normal size. Hepatobiliary: No focal hepatic abnormality. Gallbladder unremarkable. Pancreas: No focal abnormality or ductal dilatation. Spleen: No focal abnormality.  Normal size. Adrenals/Urinary Tract: No adrenal abnormality. No focal renal abnormality. No stones or hydronephrosis. Urinary bladder  is unremarkable. Stomach/Bowel: Stomach, large and small bowel grossly unremarkable. Normal appendix. Vascular/Lymphatic: No evidence of aneurysm or adenopathy. Reproductive: No visible focal abnormality. Other: No free fluid or free air. Musculoskeletal: No acute bony abnormality. IMPRESSION: Normal study. Electronically Signed   By: Charlett Nose M.D.   On: 05/08/2021 12:22   DG Chest Port 1 View  Result Date: 05/08/2021 CLINICAL DATA:  25 year old male with a history of vomiting EXAM: PORTABLE CHEST 1 VIEW COMPARISON:  04/27/2021 FINDINGS: Cardiomediastinal silhouette unchanged in size and contour. No evidence of central vascular congestion. No interlobular septal thickening. No pneumothorax or pleural effusion. Coarsened interstitial markings, with no confluent airspace disease. No acute displaced fracture. IMPRESSION: Negative for acute cardiopulmonary disease Electronically Signed   By: Gilmer Mor D.O.   On: 05/08/2021 11:23    Procedures Procedures   Medications Ordered in ED Medications  ondansetron (ZOFRAN) injection 4 mg (4 mg Intravenous Given 05/08/21 1041)  fentaNYL (SUBLIMAZE) injection 50 mcg (50 mcg Intravenous Given 05/08/21 1041)  sodium chloride 0.9 % bolus 1,000 mL (0 mLs Intravenous Stopped 05/08/21 1233)  iohexol (OMNIPAQUE) 300 MG/ML solution 100 mL (100 mLs Intravenous Contrast Given 05/08/21 1156)  sodium chloride 0.9 % bolus 1,000 mL (0 mLs Intravenous Stopped 05/08/21 1514)  sodium chloride 0.9 % bolus 500 mL (0 mLs  Intravenous Stopped 05/08/21 1547)    ED Course  I have reviewed the triage vital signs and the nursing notes.  Pertinent labs & imaging results that were available during my care of the patient were reviewed by me and considered in my medical decision making (see chart for details).    MDM Rules/Calculators/A&P                           25 year old male who presents for evaluation of nausea/vomiting has been ongoing for about 2 weeks.  Was seen here earlier in onset of symptoms and had an unremarkable work-up.  States he was discharged home with some nausea medication.  Comes in today because he states he still been vomiting is not able to tolerate any p.o.  No fevers.  Does report that he has not had a bowel movement in several weeks.  Denies any fevers.  He also reports some chest soreness and pain after vomiting.  Initially arrival, he is afebrile, nontoxic-appearing.  Vital signs are stable.  On exam, he has diffuse abdominal tenderness.  No rigidity, guarding.  No focal tenderness.  We will plan to check labs, give fluids, chest x-ray.  Suspect this is most likely chest pain after vomiting.  Do not suspect that this is an ACS.  CBC shows no leukocytosis.  Hemoglobin is 17.9.  Platelets are normal. CMP shows sodium 131, potassium 3.0, BUN 12, creatinine 1.09.  He does have an anion gap of 18.  AST is 31, ALT is 63.  Suspect this may be due to some degree of dehydration.  Lipase is normal.  UA shows small amount of ketones.  No infectious etiology.  UDS is positive for marijuana.  Chest x-ray negative for any acute abnormality.  I reviewed his work-up from previous visit.  He did not have any CT scan at that time.  Given continued and persistent vomiting we will plan for intra-abdominal imaging.  CT on pelvis shows no acute abnormalities.  Re-evaluation.  Patient looks improved.  He has been tolerating Gatorade, crackers here in ED.  Patient resting comfortably in bed.  At this time,  work-up is unremarkable.  He has not vomited since being here in the ED.  Patient stable for discharge at this time.  We will give a small amount of Zofran.  We will give him GI referral since this is been an ongoing symptom. At this time, patient exhibits no emergent life-threatening condition that require further evaluation in ED. Patient had ample opportunity for questions and discussion. All patient's questions were answered with full understanding. Strict return precautions discussed. Patient expresses understanding and agreement to plan.   Portions of this note were generated with Scientist, clinical (histocompatibility and immunogenetics). Dictation errors may occur despite best attempts at proofreading.   Final Clinical Impression(s) / ED Diagnoses Final diagnoses:  Non-intractable vomiting with nausea, unspecified vomiting type    Rx / DC Orders ED Discharge Orders          Ordered    ondansetron (ZOFRAN ODT) 4 MG disintegrating tablet  Every 8 hours PRN        05/08/21 1611             Rosana Hoes 05/08/21 2011    Arby Barrette, MD 05/10/21 [phone]

## 2021-05-08 NOTE — ED Notes (Signed)
Pt sleeping at this time.

## 2021-05-08 NOTE — ED Notes (Signed)
Pt given snack. 

## 2021-05-08 NOTE — ED Triage Notes (Signed)
Pt c/o vomiting since 7/13. Seen here on 7/15 for same. States unable to keep fluids down. C/o weakness. Denies abdominal pain. C/o generalized body pain

## 2021-05-08 NOTE — Discharge Instructions (Addendum)
Take zofran as directed.  As we discussed, eat bland foods may help your symptoms.  You to follow-up with referred GI doctor.  Return to the Emergency Department immediately if you experience any worsening abdominal pain, fever, persistent nausea and vomiting, inability keep any food down, pain with urination, blood in your urine or any other worsening or concerning symptoms.

## 2021-08-01 IMAGING — CT CT ABD-PELV W/ CM
2 of 4 series · 17 of 46 positions shown, 19 images · IV contrast (omnipaque)
Comparison: None.

CLINICAL DATA: Abdominal pain, acute, nonlocalized.  Vomiting.

EXAM:
CT ABDOMEN AND PELVIS WITH CONTRAST
TECHNIQUE: Multidetector CT imaging of the abdomen and pelvis was performed
using the standard protocol following bolus administration of
intravenous contrast.
CONTRAST:  100mL OMNIPAQUE IOHEXOL 300 MG/ML  SOLN

[Series 2: axial st · axial · 0.98mm/px · z∈[-482,+8]mm · 14 of 108 slices shown, 16 images]
[im 5/108  soft-tissue]
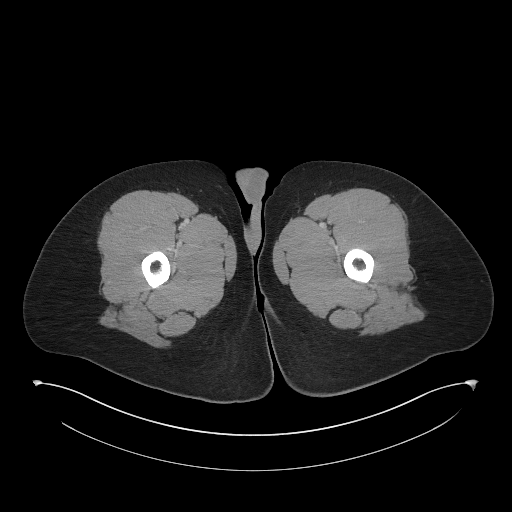
[im 5/108  bone]
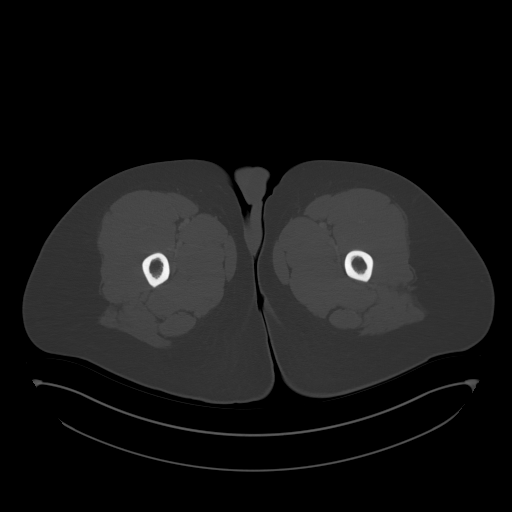
[im 14/108  soft-tissue]
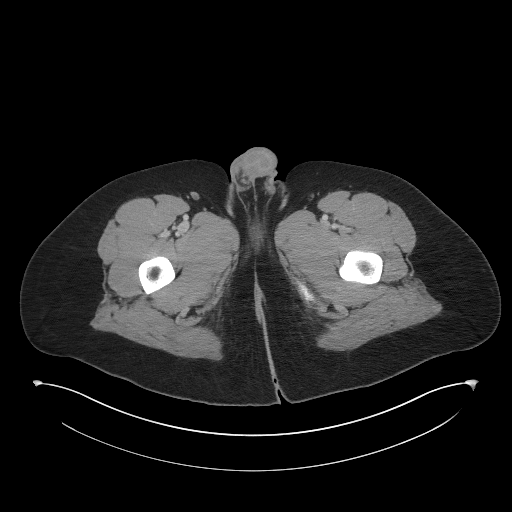
[im 23/108  soft-tissue]
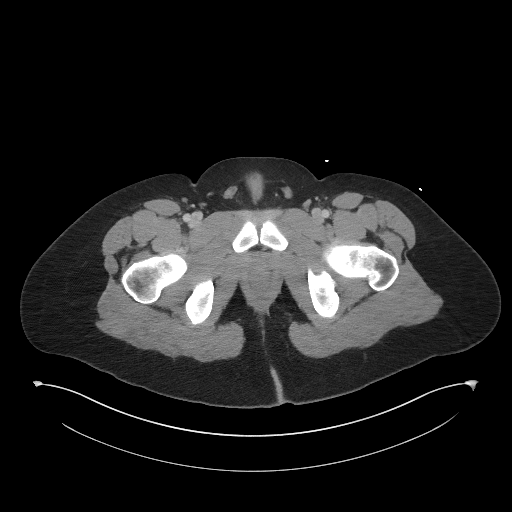
[im 27/108  soft-tissue]
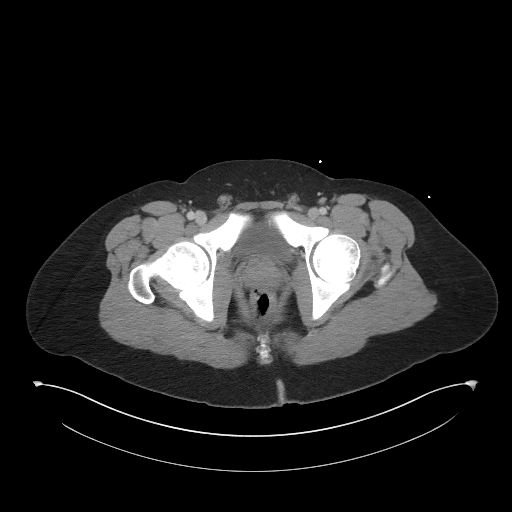
[im 36/108  soft-tissue]
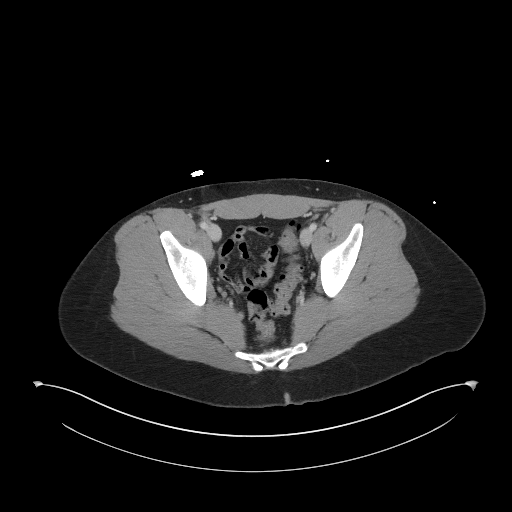
[im 45/108  soft-tissue]
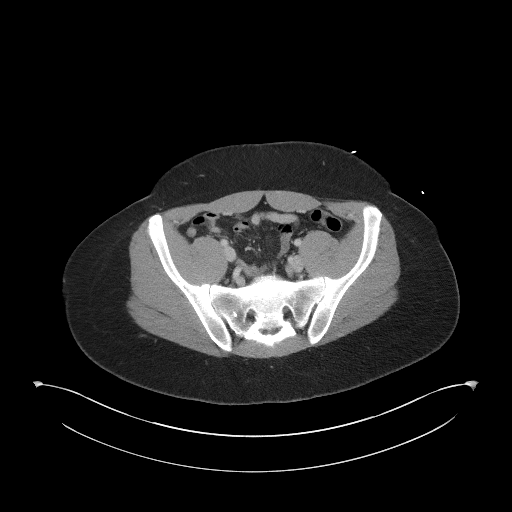
[im 50/108  soft-tissue]
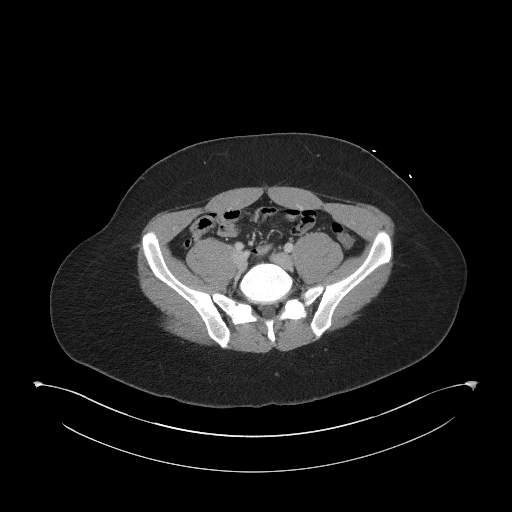
[im 58/108  soft-tissue]
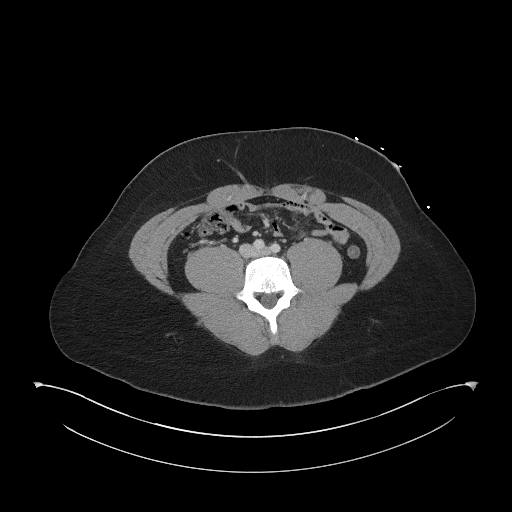
[im 63/108  soft-tissue]
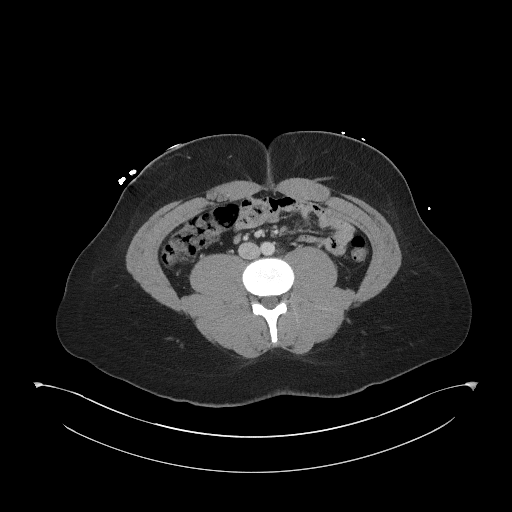
[im 63/108  bone]
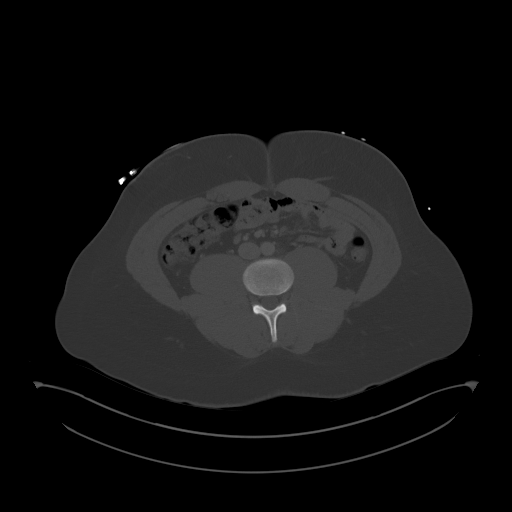
[im 72/108  soft-tissue]
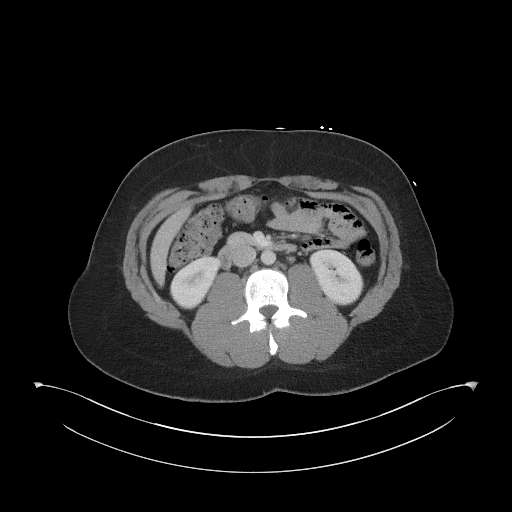
[im 81/108  soft-tissue]
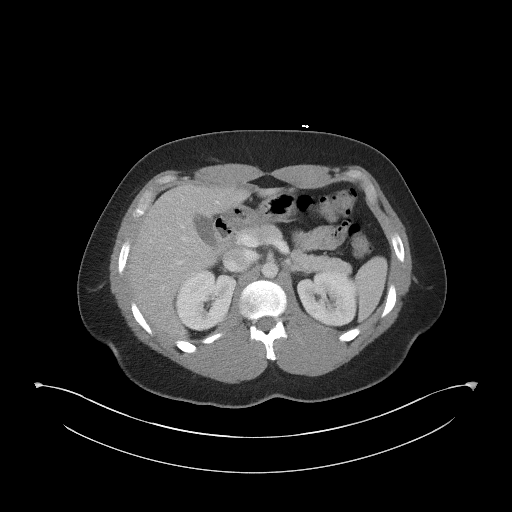
[im 85/108  soft-tissue]
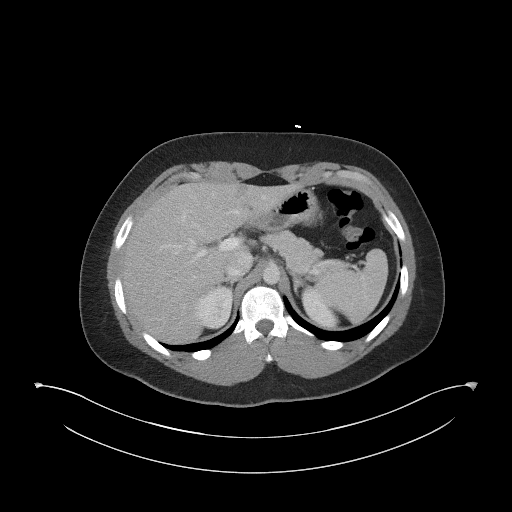
[im 94/108  soft-tissue]
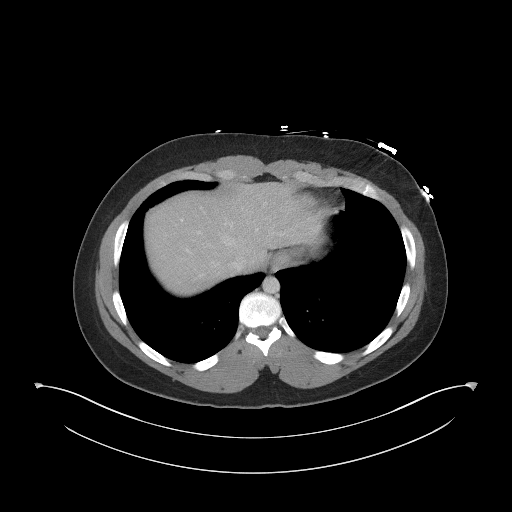
[im 103/108  soft-tissue]
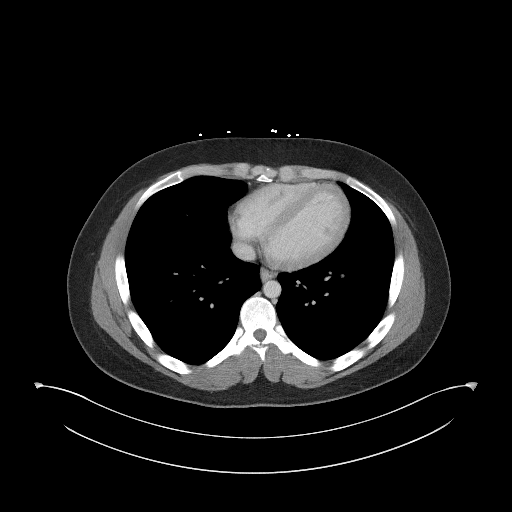

[Series 5: coronal st · coronal · 0.88mm/px · 3 of 91 slices shown]
[im 31/91  soft-tissue]
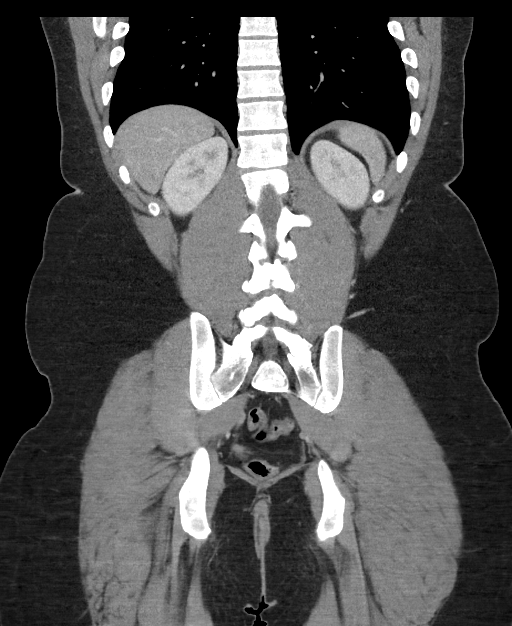
[im 41/91  soft-tissue]
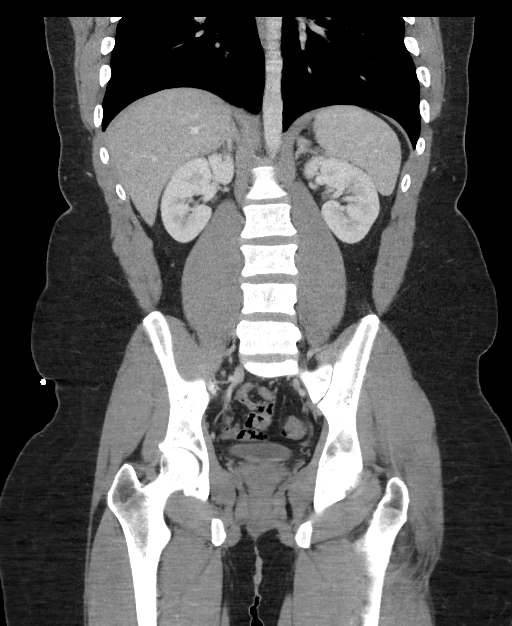
[im 51/91  soft-tissue]
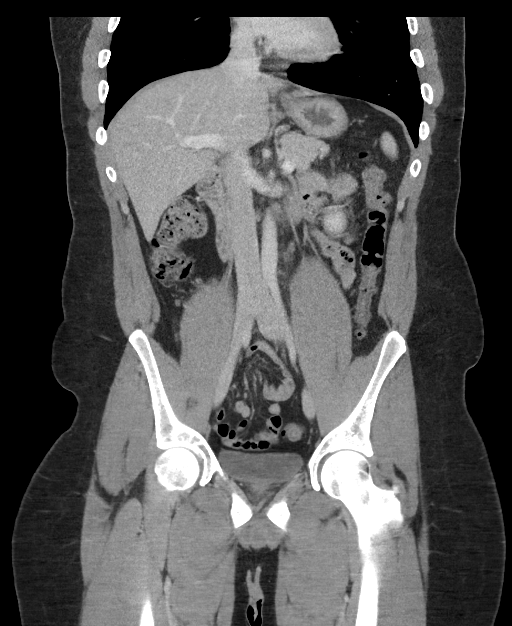

[17 of 46 positions shown; findings below may reference images not displayed]

FINDINGS: Lower chest: Lung bases are clear. No effusions. Heart is normal
size.

Hepatobiliary: No focal hepatic abnormality. Gallbladder
unremarkable.

Pancreas: No focal abnormality or ductal dilatation.

Spleen: No focal abnormality.  Normal size.

Adrenals/Urinary Tract: No adrenal abnormality. No focal renal
abnormality. No stones or hydronephrosis. Urinary bladder is
unremarkable.

Stomach/Bowel: Stomach, large and small bowel grossly unremarkable.
Normal appendix.

Vascular/Lymphatic: No evidence of aneurysm or adenopathy.

Reproductive: No visible focal abnormality.

Other: No free fluid or free air.

Musculoskeletal: No acute bony abnormality.
IMPRESSION: Normal study.

## 2022-04-08 ENCOUNTER — Encounter (HOSPITAL_COMMUNITY): Payer: Self-pay | Admitting: Emergency Medicine

## 2022-04-08 ENCOUNTER — Emergency Department (HOSPITAL_COMMUNITY)
Admission: EM | Admit: 2022-04-08 | Discharge: 2022-04-08 | Disposition: A | Discharge status: Home / Self Care | Payer: Self-pay | Attending: Emergency Medicine | Admitting: Emergency Medicine

## 2022-04-08 ENCOUNTER — Other Ambulatory Visit: Payer: Self-pay

## 2022-04-08 DIAGNOSIS — J029 Acute pharyngitis, unspecified: Secondary | ICD-10-CM | POA: Insufficient documentation

## 2022-04-08 DIAGNOSIS — R1084 Generalized abdominal pain: Secondary | ICD-10-CM | POA: Insufficient documentation

## 2022-04-08 DIAGNOSIS — R112 Nausea with vomiting, unspecified: Secondary | ICD-10-CM | POA: Insufficient documentation

## 2022-04-08 DIAGNOSIS — R1013 Epigastric pain: Secondary | ICD-10-CM

## 2022-04-08 LAB — COMPREHENSIVE METABOLIC PANEL
ALT: 49 U/L — ABNORMAL HIGH (ref 0–44)
AST: 32 U/L (ref 15–41)
Albumin: 4.2 g/dL (ref 3.5–5.0)
Alkaline Phosphatase: 68 U/L (ref 38–126)
Anion gap: 10 (ref 5–15)
BUN: 8 mg/dL (ref 6–20)
CO2: 24 mmol/L (ref 22–32)
Calcium: 9.2 mg/dL (ref 8.9–10.3)
Chloride: 109 mmol/L (ref 98–111)
Creatinine, Ser: 0.95 mg/dL (ref 0.61–1.24)
GFR, Estimated: >60 mL/min (ref >60)
Glucose, Bld: 115 mg/dL — ABNORMAL HIGH (ref 70–99)
Potassium: 3.5 mmol/L (ref 3.5–5.1)
Sodium: 143 mmol/L (ref 135–145)
Total Bilirubin: 0.7 mg/dL (ref 0.3–1.2)
Total Protein: 8.1 g/dL (ref 6.5–8.1)

## 2022-04-08 LAB — CBC WITH DIFFERENTIAL/PLATELET
Abs Immature Granulocytes: 0.01 10*3/uL (ref 0.00–0.07)
Basophils Absolute: 0 10*3/uL (ref 0.0–0.1)
Basophils Relative: 0 %
Eosinophils Absolute: 0 10*3/uL (ref 0.0–0.5)
Eosinophils Relative: 0 %
HCT: 46.5 % (ref 39.0–52.0)
Hemoglobin: 15.8 g/dL (ref 13.0–17.0)
Immature Granulocytes: 0 %
Lymphocytes Relative: 12 %
Lymphs Abs: 1.1 10*3/uL (ref 0.7–4.0)
MCH: 31.9 pg (ref 26.0–34.0)
MCHC: 34 g/dL (ref 30.0–36.0)
MCV: 93.8 fL (ref 80.0–100.0)
Monocytes Absolute: 0.5 10*3/uL (ref 0.1–1.0)
Monocytes Relative: 6 %
Neutro Abs: 7.4 10*3/uL (ref 1.7–7.7)
Neutrophils Relative %: 82 %
Platelets: 216 10*3/uL (ref 150–400)
RBC: 4.96 MIL/uL (ref 4.22–5.81)
RDW: 13.2 % (ref 11.5–15.5)
WBC: 9 10*3/uL (ref 4.0–10.5)
nRBC: 0 % (ref 0.0–0.2)

## 2022-04-08 LAB — LIPASE, BLOOD: Lipase: 30 U/L (ref 11–51)

## 2022-04-08 MED ORDER — ONDANSETRON HCL 4 MG PO TABS: 4.0000 mg | ORAL_TABLET | Freq: Three times a day (TID) | ORAL | 0 refills | Status: DC | PRN

## 2022-04-08 MED ORDER — FAMOTIDINE IN NACL 20-0.9 MG/50ML-% IV SOLN
20.0000 mg | Freq: Once | INTRAVENOUS | Status: AC
  Administered 2022-04-08: 20 mg via INTRAVENOUS
  Filled 2022-04-08: qty 50

## 2022-04-08 MED ORDER — FAMOTIDINE 20 MG PO TABS: 20.0000 mg | ORAL_TABLET | Freq: Two times a day (BID) | ORAL | 0 refills | Status: DC

## 2022-04-08 MED ORDER — METOCLOPRAMIDE HCL 5 MG/ML IJ SOLN
10.0000 mg | Freq: Once | INTRAMUSCULAR | Status: AC
  Administered 2022-04-08: 10 mg via INTRAVENOUS
  Filled 2022-04-08: qty 2

## 2022-04-08 MED ORDER — SODIUM CHLORIDE 0.9 % IV BOLUS
1000.0000 mL | Freq: Once | INTRAVENOUS | Status: AC
  Administered 2022-04-08: 1000 mL via INTRAVENOUS

## 2022-04-08 MED ORDER — DROPERIDOL 2.5 MG/ML IJ SOLN
1.2500 mg | Freq: Once | INTRAMUSCULAR | Status: AC
  Administered 2022-04-08: 1.25 mg via INTRAVENOUS
  Filled 2022-04-08: qty 2

## 2022-04-08 NOTE — ED Notes (Signed)
Pt given ice chips for PO challenge °

## 2022-04-08 NOTE — ED Notes (Signed)
Pt ambulated to the restroom without assistance.  Pt given 2 warm blankets, emesis bag, and urinal at bedside.

## 2022-04-14 ENCOUNTER — Encounter (HOSPITAL_COMMUNITY): Payer: Self-pay | Admitting: Emergency Medicine

## 2022-04-14 ENCOUNTER — Emergency Department (HOSPITAL_COMMUNITY)
Admission: EM | Admit: 2022-04-14 | Discharge: 2022-04-14 | Discharge status: Against Medical Advice | Payer: Self-pay | Attending: Emergency Medicine | Admitting: Emergency Medicine

## 2022-04-14 ENCOUNTER — Other Ambulatory Visit: Payer: Self-pay

## 2022-04-14 DIAGNOSIS — Z5321 Procedure and treatment not carried out due to patient leaving prior to being seen by health care provider: Secondary | ICD-10-CM | POA: Insufficient documentation

## 2022-04-14 DIAGNOSIS — K5900 Constipation, unspecified: Secondary | ICD-10-CM | POA: Insufficient documentation

## 2022-04-14 DIAGNOSIS — R112 Nausea with vomiting, unspecified: Secondary | ICD-10-CM | POA: Insufficient documentation

## 2022-04-14 NOTE — ED Triage Notes (Signed)
Per EMS, patient from home, N/V x1 week. Seen for same on 6/26. Uncooperative with EMS.  BP 118/70 HR 80 CBG 100

## 2022-04-14 NOTE — ED Provider Triage Note (Signed)
Emergency Medicine Provider Triage Evaluation Note  Christopher Richardson , a 26 y.o. male  was evaluated in triage.  Pt complains of nausea and vomiting.  Patient reports that he has had persistent nausea and vomiting over the last week.  Patient states that he is able to tolerate p.o. intake however has not because "he wants a full stomach of water, it is uncomfortable."  Patient reports that he has not been able to tolerate p.o. intake and when he tries to eat he does vomit.  Patient reports that he has not vomited in the last 24 hours.  Patient does endorse constipation stating he has not had a bowel movement last week.  Review of Systems  Positive: Nausea, vomiting Negative: Abdominal pain, diarrhea, blood in stool, melena, dysuria, hematuria, urinary urgency, urinary frequency, swelling or tenderness to genitals  Physical Exam  BP 119/88 (BP Location: Left Arm)   Pulse 87   Temp 98.7 F (37.1 C) (Oral)   Resp (!) 22   SpO2 98%  Gen:   Awake, no distress   Resp:  Normal effort  MSK:   Moves extremities without difficulty  Other:  Abdomen soft, nondistended, nontender no guarding or rebound tenderness.  Medical Decision Making  Medically screening exam initiated at 3:23 PM.  Appropriate orders placed.  Christopher Richardson was informed that the remainder of the evaluation will be completed by another provider, this initial triage assessment does not replace that evaluation, and the importance of remaining in the ED until their evaluation is complete.  Abdominal work-up labs ordered.   Haskel Schroeder, [place] 04/14/22 1524

## 2022-04-14 NOTE — ED Notes (Signed)
Uanble to get labs x1

## 2022-04-15 ENCOUNTER — Encounter (HOSPITAL_BASED_OUTPATIENT_CLINIC_OR_DEPARTMENT_OTHER): Payer: Self-pay

## 2022-04-15 ENCOUNTER — Emergency Department (HOSPITAL_BASED_OUTPATIENT_CLINIC_OR_DEPARTMENT_OTHER)
Admission: EM | Admit: 2022-04-15 | Discharge: 2022-04-15 | Disposition: A | Discharge status: Home / Self Care | Payer: Self-pay | Attending: Emergency Medicine | Admitting: Emergency Medicine

## 2022-04-15 ENCOUNTER — Encounter: Payer: Self-pay | Admitting: Internal Medicine

## 2022-04-15 DIAGNOSIS — E876 Hypokalemia: Secondary | ICD-10-CM | POA: Insufficient documentation

## 2022-04-15 DIAGNOSIS — R1013 Epigastric pain: Secondary | ICD-10-CM | POA: Insufficient documentation

## 2022-04-15 DIAGNOSIS — R1110 Vomiting, unspecified: Secondary | ICD-10-CM | POA: Insufficient documentation

## 2022-04-15 LAB — CBC WITH DIFFERENTIAL/PLATELET
Abs Immature Granulocytes: 0.02 10*3/uL (ref 0.00–0.07)
Basophils Absolute: 0 10*3/uL (ref 0.0–0.1)
Basophils Relative: 1 %
Eosinophils Absolute: 0 10*3/uL (ref 0.0–0.5)
Eosinophils Relative: 1 %
HCT: 49.5 % (ref 39.0–52.0)
Hemoglobin: 17.8 g/dL — ABNORMAL HIGH (ref 13.0–17.0)
Immature Granulocytes: 0 %
Lymphocytes Relative: 18 %
Lymphs Abs: 1.3 10*3/uL (ref 0.7–4.0)
MCH: 31.8 pg (ref 26.0–34.0)
MCHC: 36 g/dL (ref 30.0–36.0)
MCV: 88.4 fL (ref 80.0–100.0)
Monocytes Absolute: 0.7 10*3/uL (ref 0.1–1.0)
Monocytes Relative: 9 %
Neutro Abs: 5.2 10*3/uL (ref 1.7–7.7)
Neutrophils Relative %: 71 %
Platelets: 273 10*3/uL (ref 150–400)
RBC: 5.6 MIL/uL (ref 4.22–5.81)
RDW: 12.2 % (ref 11.5–15.5)
WBC: 7.2 10*3/uL (ref 4.0–10.5)
nRBC: 0 % (ref 0.0–0.2)

## 2022-04-15 LAB — COMPREHENSIVE METABOLIC PANEL WITH GFR
ALT: 73 U/L — ABNORMAL HIGH (ref 0–44)
AST: 36 U/L (ref 15–41)
Albumin: 4.4 g/dL (ref 3.5–5.0)
Alkaline Phosphatase: 61 U/L (ref 38–126)
Anion gap: 10 (ref 5–15)
BUN: 17 mg/dL (ref 6–20)
CO2: 28 mmol/L (ref 22–32)
Calcium: 9.3 mg/dL (ref 8.9–10.3)
Chloride: 97 mmol/L — ABNORMAL LOW (ref 98–111)
Creatinine, Ser: 1.03 mg/dL (ref 0.61–1.24)
GFR, Estimated: >60 mL/min (ref >60)
Glucose, Bld: 100 mg/dL — ABNORMAL HIGH (ref 70–99)
Potassium: 2.9 mmol/L — ABNORMAL LOW (ref 3.5–5.1)
Sodium: 135 mmol/L (ref 135–145)
Total Bilirubin: 2.2 mg/dL — ABNORMAL HIGH (ref 0.3–1.2)
Total Protein: 8.8 g/dL — ABNORMAL HIGH (ref 6.5–8.1)

## 2022-04-15 LAB — LIPASE, BLOOD: Lipase: 32 U/L (ref 11–51)

## 2022-04-15 MED ORDER — ONDANSETRON HCL 4 MG PO TABS: 4.0000 mg | ORAL_TABLET | Freq: Three times a day (TID) | ORAL | 0 refills | Status: DC | PRN

## 2022-04-15 MED ORDER — POTASSIUM CHLORIDE ER 20 MEQ PO TBCR: 10.0000 meq | EXTENDED_RELEASE_TABLET | Freq: Two times a day (BID) | ORAL | 0 refills | Status: DC

## 2022-04-15 MED ORDER — POTASSIUM CHLORIDE CRYS ER 20 MEQ PO TBCR
40.0000 meq | EXTENDED_RELEASE_TABLET | Freq: Once | ORAL | Status: AC
  Administered 2022-04-15: 40 meq via ORAL
  Filled 2022-04-15: qty 2

## 2022-04-15 MED ORDER — SODIUM CHLORIDE 0.9 % IV BOLUS
1000.0000 mL | Freq: Once | INTRAVENOUS | Status: AC
Start: 2022-04-15 — End: 2022-04-15
  Administered 2022-04-15: 1000 mL via INTRAVENOUS

## 2022-04-15 MED ORDER — ONDANSETRON HCL 4 MG/2ML IJ SOLN
4.0000 mg | Freq: Once | INTRAMUSCULAR | Status: AC
  Administered 2022-04-15: 4 mg via INTRAVENOUS
  Filled 2022-04-15: qty 2

## 2022-04-15 NOTE — Discharge Instructions (Signed)
Call your primary care doctor or specialist as discussed in the next 2-3 days.   Return immediately back to the ER if:  Your symptoms worsen within the next 12-24 hours. You develop new symptoms such as new fevers, persistent vomiting, new pain, shortness of breath, or new weakness or numbness, or if you have any other concerns.  

## 2022-04-15 NOTE — ED Provider Notes (Signed)
[facility] EMERGENCY DEPARTMENT Provider Note   CSN: [id number] Arrival date & time: 04/15/22  0746     History  Chief Complaint  Patient presents with   Vomiting    Christopher Richardson is a 26 y.o. male.  Patient presents chief complaint of vomiting.  He states that he oftentimes had vomiting episodes when he drinks.  Last drink was about a week ago has been vomiting for about a week now.  Denies any blood in the vomitus.  Denies any diarrhea.  Having intermittent epigastric pain as well.  Denies fevers or cough headache or chest pain.       Home Medications Prior to Admission medications   Medication Sig Start Date End Date Taking? Authorizing Provider  famotidine (PEPCID) 20 MG tablet Take 1 tablet (20 mg total) by mouth 2 (two) times daily. 04/08/22   Terrilee Files, MD  metoCLOPramide (REGLAN) 10 MG tablet Take 1 tablet (10 mg total) by mouth every 6 (six) hours. 09/14/19   Law, Waylan Boga, PA-C  ondansetron (ZOFRAN ODT) 4 MG disintegrating tablet Take 1 tablet (4 mg total) by mouth every 8 (eight) hours as needed for nausea or vomiting. 05/08/21   Graciella Freer A, PA-C  ondansetron (ZOFRAN) 4 MG tablet Take 1 tablet (4 mg total) by mouth every 8 (eight) hours as needed for nausea or vomiting. 04/15/22   Cheryll Cockayne, MD  potassium chloride 20 MEQ TBCR Take 10 mEq by mouth 2 (two) times daily for 2 days. 04/15/22 04/17/22  Cheryll Cockayne, MD      Allergies    Patient has no known allergies.    Review of Systems   Review of Systems  Constitutional:  Negative for fever.  HENT:  Negative for ear pain and sore throat.   Eyes:  Negative for pain.  Respiratory:  Negative for cough.   Cardiovascular:  Negative for chest pain.  Gastrointestinal:  Positive for abdominal pain.  Genitourinary:  Negative for flank pain.  Musculoskeletal:  Negative for back pain.  Skin:  Negative for color change and rash.  Neurological:  Negative for syncope.  All other systems reviewed  and are negative.   Physical Exam Updated Vital Signs BP (!) 123/103   Pulse (!) 59   Temp 98.1 F (36.7 C) (Oral)   Resp 17   Ht 5\' 11"  (1.803 m)   Wt 104.3 kg   SpO2 97%   BMI 32.08 kg/m  Physical Exam Constitutional:      Appearance: He is well-developed.  HENT:     Head: Normocephalic.     Nose: Nose normal.  Eyes:     Extraocular Movements: Extraocular movements intact.  Cardiovascular:     Rate and Rhythm: Normal rate.  Pulmonary:     Effort: Pulmonary effort is normal.  Abdominal:     Tenderness: There is no abdominal tenderness. There is no guarding or rebound.  Skin:    Coloration: Skin is not jaundiced.  Neurological:     Mental Status: He is alert. Mental status is at baseline.     ED Results / Procedures / Treatments   Labs (all labs ordered are listed, but only abnormal results are displayed) Labs Reviewed  CBC WITH DIFFERENTIAL/PLATELET - Abnormal; Notable for the following components:      Result Value   Hemoglobin 17.8 (*)    All other components within normal limits  COMPREHENSIVE METABOLIC PANEL - Abnormal; Notable for the following components:   Potassium 2.9 (*)  Chloride 97 (*)    Glucose, Bld 100 (*)    Total Protein 8.8 (*)    ALT 73 (*)    Total Bilirubin 2.2 (*)    All other components within normal limits  LIPASE, BLOOD    EKG EKG Interpretation  Date/Time:  Monday April 15 2022 08:03:55 EDT Ventricular Rate:  83 PR Interval:  118 QRS Duration: 78 QT Interval:  363 QTC Calculation: 427 R Axis:   74 Text Interpretation: Sinus rhythm Borderline short PR interval Consider right atrial enlargement ST elevation, consider inferior injury Confirmed by Norman Clay ([id number]) on 04/15/2022 8:08:23 AM  Radiology No results found.  Procedures Procedures    Medications Ordered in ED Medications  sodium chloride 0.9 % bolus 1,000 mL (0 mLs Intravenous Stopped 04/15/22 0949)  ondansetron (ZOFRAN) injection 4 mg (4 mg Intravenous Given  04/15/22 0846)  potassium chloride SA (KLOR-CON M) CR tablet 40 mEq (40 mEq Oral Given 04/15/22 0935)    ED Course/ Medical Decision Making/ A&P                           Medical Decision Making Amount and/or Complexity of Data Reviewed Labs: ordered.  Risk Prescription drug management.   Review of record shows multiple visits for similar episodes of vomiting.  Last visit was April 08, 2022.  Cardiac monitoring showing sinus rhythm.  Comorbidities influencing complexity include history of chronic intermittent vomiting.  Labs today show hypokalemia potassium 2.9 bicarb normal at 28.  White count normal hemoglobin shows some hemoconcentration of 17.  Lipase is normal.  Patient given Zofran liter bolus of fluids as well as potassium replacement here in the ER.  Patient tolerating oral potassium replacement here in the ER no longer vomiting.  Will be discharged home with a prescription of potassium and Zofran.  Advise follow-up with primary care physician or gastroenterology within the week.  Advised return for worsening symptoms or any additional concerns.          Final Clinical Impression(s) / ED Diagnoses Final diagnoses:  Vomiting, unspecified vomiting type, unspecified whether nausea present  Hypokalemia    Rx / DC Orders ED Discharge Orders          Ordered    ondansetron (ZOFRAN) 4 MG tablet  Every 8 hours PRN        04/15/22 1004    potassium chloride 20 MEQ TBCR  2 times daily        04/15/22 1004              [place], Eustace Moore, MD 04/15/22 1005

## 2022-04-15 NOTE — ED Triage Notes (Signed)
C/o vomiting x 1 week, decreased intake. States normally drinks alcohol daily & smokes marijuana & cigarettes but hasnt recently. Last drink last Sunday

## 2022-04-22 ENCOUNTER — Encounter (HOSPITAL_BASED_OUTPATIENT_CLINIC_OR_DEPARTMENT_OTHER): Payer: Self-pay

## 2022-04-22 ENCOUNTER — Other Ambulatory Visit: Payer: Self-pay

## 2022-04-22 ENCOUNTER — Emergency Department (HOSPITAL_BASED_OUTPATIENT_CLINIC_OR_DEPARTMENT_OTHER)
Admission: EM | Admit: 2022-04-22 | Discharge: 2022-04-22 | Disposition: A | Discharge status: Home / Self Care | Payer: Self-pay | Attending: Emergency Medicine | Admitting: Emergency Medicine

## 2022-04-22 DIAGNOSIS — R112 Nausea with vomiting, unspecified: Secondary | ICD-10-CM | POA: Insufficient documentation

## 2022-04-22 DIAGNOSIS — R079 Chest pain, unspecified: Secondary | ICD-10-CM | POA: Insufficient documentation

## 2022-04-22 DIAGNOSIS — E876 Hypokalemia: Secondary | ICD-10-CM | POA: Insufficient documentation

## 2022-04-22 LAB — CBC
HCT: 46.4 % (ref 39.0–52.0)
Hemoglobin: 17.3 g/dL — ABNORMAL HIGH (ref 13.0–17.0)
MCH: 31.7 pg (ref 26.0–34.0)
MCHC: 37.3 g/dL — ABNORMAL HIGH (ref 30.0–36.0)
MCV: 85.1 fL (ref 80.0–100.0)
Platelets: 322 10*3/uL (ref 150–400)
RBC: 5.45 MIL/uL (ref 4.22–5.81)
RDW: 11.9 % (ref 11.5–15.5)
WBC: 5.8 10*3/uL (ref 4.0–10.5)
nRBC: 0 % (ref 0.0–0.2)

## 2022-04-22 LAB — COMPREHENSIVE METABOLIC PANEL
ALT: 52 U/L — ABNORMAL HIGH (ref 0–44)
AST: 23 U/L (ref 15–41)
Albumin: 4.8 g/dL (ref 3.5–5.0)
Alkaline Phosphatase: 60 U/L (ref 38–126)
Anion gap: 17 — ABNORMAL HIGH (ref 5–15)
BUN: 8 mg/dL (ref 6–20)
CO2: 21 mmol/L — ABNORMAL LOW (ref 22–32)
Calcium: 10.2 mg/dL (ref 8.9–10.3)
Chloride: 96 mmol/L — ABNORMAL LOW (ref 98–111)
Creatinine, Ser: 1.1 mg/dL (ref 0.61–1.24)
GFR, Estimated: >60 mL/min (ref >60)
Glucose, Bld: 99 mg/dL (ref 70–99)
Potassium: 2.9 mmol/L — ABNORMAL LOW (ref 3.5–5.1)
Sodium: 134 mmol/L — ABNORMAL LOW (ref 135–145)
Total Bilirubin: 1.1 mg/dL (ref 0.3–1.2)
Total Protein: 8.4 g/dL — ABNORMAL HIGH (ref 6.5–8.1)

## 2022-04-22 LAB — LIPASE, BLOOD: Lipase: 36 U/L (ref 11–51)

## 2022-04-22 MED ORDER — POTASSIUM CHLORIDE CRYS ER 20 MEQ PO TBCR: 20.0000 meq | EXTENDED_RELEASE_TABLET | Freq: Two times a day (BID) | ORAL | 0 refills | Status: DC

## 2022-04-22 MED ORDER — ONDANSETRON HCL 4 MG/2ML IJ SOLN
4.0000 mg | Freq: Once | INTRAMUSCULAR | Status: AC
  Administered 2022-04-22: 4 mg via INTRAVENOUS
  Filled 2022-04-22: qty 2

## 2022-04-22 MED ORDER — POTASSIUM CHLORIDE 10 MEQ/100ML IV SOLN
10.0000 meq | Freq: Once | INTRAVENOUS | Status: AC
  Administered 2022-04-22: 10 meq via INTRAVENOUS
  Filled 2022-04-22: qty 100

## 2022-04-22 MED ORDER — SODIUM CHLORIDE 0.9 % IV BOLUS
1000.0000 mL | Freq: Once | INTRAVENOUS | Status: AC
  Administered 2022-04-22: 1000 mL via INTRAVENOUS

## 2022-04-22 MED ORDER — POTASSIUM CHLORIDE 10 MEQ/100ML IV SOLN: 10.0000 meq | INTRAVENOUS | Status: DC

## 2022-04-22 MED ORDER — METOCLOPRAMIDE HCL 10 MG PO TABS: 10.0000 mg | ORAL_TABLET | Freq: Four times a day (QID) | ORAL | 0 refills | Status: DC

## 2022-04-22 MED ORDER — FAMOTIDINE IN NACL 20-0.9 MG/50ML-% IV SOLN
20.0000 mg | Freq: Once | INTRAVENOUS | Status: AC
  Administered 2022-04-22: 20 mg via INTRAVENOUS
  Filled 2022-04-22: qty 50

## 2022-04-22 MED ORDER — POTASSIUM CHLORIDE CRYS ER 20 MEQ PO TBCR
40.0000 meq | EXTENDED_RELEASE_TABLET | Freq: Once | ORAL | Status: AC
  Administered 2022-04-22: 40 meq via ORAL
  Filled 2022-04-22: qty 2

## 2022-04-22 NOTE — ED Notes (Signed)
Patient taking small sips of water, states he doesn't think he can swallow pills

## 2022-04-22 NOTE — ED Provider Notes (Signed)
[facility] EMERGENCY DEPARTMENT Provider Note   CSN: [id number] Arrival date & time: 04/22/22  1028     History  Chief Complaint  Patient presents with   Vomiting    Christopher Richardson is a 26 y.o. male who presents the emergency department complaining of vomiting for 3 weeks.  Was seen in the ER a week ago, and states his symptoms returned and continued after being discharged.  He has been having some chest pain, that he thinks is due to stomach acid with vomiting.  Reports that his sister has been telling he has been "passing out", but he cannot give any further details about this.  Also complaining of generalized weakness and muscle cramping.  HPI     Home Medications Prior to Admission medications   Medication Sig Start Date End Date Taking? Authorizing Provider  metoCLOPramide (REGLAN) 10 MG tablet Take 1 tablet (10 mg total) by mouth every 6 (six) hours. 04/22/22  Yes Liyah Higham T, PA-C  potassium chloride SA (KLOR-CON M) 20 MEQ tablet Take 1 tablet (20 mEq total) by mouth 2 (two) times daily for 3 days. 04/22/22 04/25/22 Yes Joaquin Knebel T, PA-C  famotidine (PEPCID) 20 MG tablet Take 1 tablet (20 mg total) by mouth 2 (two) times daily. 04/08/22   Terrilee Files, MD  ondansetron (ZOFRAN) 4 MG tablet Take 1 tablet (4 mg total) by mouth every 8 (eight) hours as needed for nausea or vomiting. 04/15/22   Cheryll Cockayne, MD      Allergies    Patient has no known allergies.    Review of Systems   Review of Systems  Constitutional:  Positive for chills. Negative for fever.  Respiratory:  Negative for shortness of breath.   Cardiovascular:  Positive for chest pain.  Gastrointestinal:  Positive for abdominal pain, nausea and vomiting. Negative for diarrhea.  Genitourinary:  Negative for dysuria.  Musculoskeletal:  Positive for myalgias.  Neurological:  Positive for syncope.  All other systems reviewed and are negative.   Physical Exam Updated Vital Signs BP  132/74   Pulse 74   Temp 98.4 F (36.9 C) (Oral)   Resp 16   Ht 5\' 11"  (1.803 m)   Wt 104.3 kg   SpO2 100%   BMI 32.07 kg/m  Physical Exam Vitals and nursing note reviewed.  Constitutional:      Appearance: Normal appearance. He is ill-appearing.  HENT:     Head: Normocephalic and atraumatic.  Eyes:     Conjunctiva/sclera: Conjunctivae normal.  Cardiovascular:     Rate and Rhythm: Normal rate and regular rhythm.  Pulmonary:     Effort: Pulmonary effort is normal. No respiratory distress.     Breath sounds: Normal breath sounds.  Abdominal:     General: There is no distension.     Palpations: Abdomen is soft.     Tenderness: There is no abdominal tenderness.  Skin:    General: Skin is warm and dry.  Neurological:     General: No focal deficit present.     Mental Status: He is alert.     ED Results / Procedures / Treatments   Labs (all labs ordered are listed, but only abnormal results are displayed) Labs Reviewed  COMPREHENSIVE METABOLIC PANEL - Abnormal; Notable for the following components:      Result Value   Sodium 134 (*)    Potassium 2.9 (*)    Chloride 96 (*)    CO2 21 (*)  Total Protein 8.4 (*)    ALT 52 (*)    Anion gap 17 (*)    All other components within normal limits  CBC - Abnormal; Notable for the following components:   Hemoglobin 17.3 (*)    MCHC 37.3 (*)    All other components within normal limits  LIPASE, BLOOD  URINALYSIS, ROUTINE W REFLEX MICROSCOPIC  RAPID URINE DRUG SCREEN, HOSP PERFORMED    EKG None  Radiology No results found.  Procedures Procedures    Medications Ordered in ED Medications  sodium chloride 0.9 % bolus 1,000 mL (0 mLs Intravenous Stopped 04/22/22 1221)  ondansetron (ZOFRAN) injection 4 mg (4 mg Intravenous Given 04/22/22 1102)  sodium chloride 0.9 % bolus 1,000 mL (0 mLs Intravenous Stopped 04/22/22 1310)  potassium chloride SA (KLOR-CON M) CR tablet 40 mEq (40 mEq Oral Given 04/22/22 1434)  famotidine  (PEPCID) IVPB 20 mg premix (0 mg Intravenous Stopped 04/22/22 1306)  potassium chloride 10 mEq in 100 mL IVPB (0 mEq Intravenous Stopped 04/22/22 1417)    ED Course/ Medical Decision Making/ A&P                           Medical Decision Making Amount and/or Complexity of Data Reviewed Labs: ordered.  Risk Prescription drug management.   This patient is a 26 y.o. male  who presents to the ED for concern of vomiting and generalized body cramps x3 weeks.   Differential diagnoses prior to evaluation: The emergent differential diagnosis includes, but is not limited to,  ACS/MI, Boerhaave's, DKA, elevated ICP, Ischemic bowel, Sepsis, Drug-related (toxicity, THC hyperemesis, ETOH, withdrawal), Appendicitis, Bowel obstruction, Electrolyte abnormalities, Pancreatitis, Biliary colic, Gastroenteritis, Gastroparesis, Hepatitis, Migraine, Thyroid disease, Renal colic, GERD/PUD, UTI, Testicular torsion. This is not an exhaustive differential.   Past Medical History / Co-morbidities: History reviewed. No pertinent past medical history.  Additional history: Chart reviewed. Pertinent results include: Patient was seen in the ER for similar symptoms on 6/26 and 7/3.  Most recent visit was noted to have a potassium of 2.9, given IV fluids, Zofran, and potassium replacement.  Physical Exam: Physical exam performed. The pertinent findings include: Patient is afebrile, not tachycardic, in no acute distress but is ill-appearing.  Lab Tests/Imaging studies: I personally interpreted labs/imaging and the pertinent results include:  Hemoglobin 17.3, likely hemoconcentrated. No leukocytosis. Potassium 2.9. Normal kidney function. CO2 of 21. Anion gap of 17, likely in the setting of dehydration.    Medications: I ordered medication including IV fluids, pepcid, zofran, potassium replacement.  I have reviewed the patients home medicines and have made adjustments as needed. Passed PO challenge and no recurrence of  symptoms during 5 hour observation in ER.    Disposition: After consideration of the diagnostic results and the patients response to treatment, I feel that emergency department workup does not suggest an emergent condition requiring admission or immediate intervention beyond what has been performed at this time. The plan is: discharge to home with prescription for reglan and potassium. Recommended follow up with gastroenterology. The patient is safe for discharge and has been instructed to return immediately for worsening symptoms, change in symptoms or any other concerns.         Final Clinical Impression(s) / ED Diagnoses Final diagnoses:  Nausea and vomiting, unspecified vomiting type  Hypokalemia    Rx / DC Orders ED Discharge Orders          Ordered    metoCLOPramide (REGLAN) 10  MG tablet  Every 6 hours        04/22/22 1532    potassium chloride SA (KLOR-CON M) 20 MEQ tablet  2 times daily        04/22/22 1532           Portions of this report may have been transcribed using voice recognition software. Every effort was made to ensure accuracy; however, inadvertent computerized transcription errors may be present.    Su Monks, PA-C 04/22/22 1535    Tanda Rockers A, DO 04/25/22 1912

## 2022-04-22 NOTE — ED Notes (Signed)
Potassium crushed and dissolved in 4 oz of water, patient able to take the medication

## 2022-04-22 NOTE — Discharge Instructions (Signed)
You were seen in the emergency department for vomiting.  As we discussed your potassium was low again, we have replaced this for you.  I am writing you some oral potassium replacement for the next several days.  Writing you a prescription for a different nausea medicine.  It is very important you follow-up with the gastroenterologist to talk about long-term management of your symptoms.

## 2022-04-22 NOTE — ED Notes (Signed)
Patient given discharge instructions, all questions answered. Patient in possession of all belongings, directed to the discharge area  

## 2022-04-22 NOTE — ED Notes (Signed)
Patient given crackers and sprite  

## 2022-04-22 NOTE — ED Triage Notes (Signed)
Vomiting x 3 weeks. Has had chest pain, syncope, cramping, weakness.

## 2022-04-24 ENCOUNTER — Emergency Department (HOSPITAL_COMMUNITY)
Admission: EM | Admit: 2022-04-24 | Discharge: 2022-04-24 | Disposition: A | Discharge status: Home / Self Care | Payer: Self-pay | Attending: Emergency Medicine | Admitting: Emergency Medicine

## 2022-04-24 ENCOUNTER — Emergency Department (HOSPITAL_COMMUNITY): Payer: Self-pay

## 2022-04-24 ENCOUNTER — Other Ambulatory Visit: Payer: Self-pay

## 2022-04-24 ENCOUNTER — Ambulatory Visit: Payer: Self-pay | Admitting: Internal Medicine

## 2022-04-24 DIAGNOSIS — R112 Nausea with vomiting, unspecified: Secondary | ICD-10-CM | POA: Insufficient documentation

## 2022-04-24 DIAGNOSIS — R109 Unspecified abdominal pain: Secondary | ICD-10-CM | POA: Insufficient documentation

## 2022-04-24 DIAGNOSIS — F17200 Nicotine dependence, unspecified, uncomplicated: Secondary | ICD-10-CM | POA: Insufficient documentation

## 2022-04-24 DIAGNOSIS — R0602 Shortness of breath: Secondary | ICD-10-CM | POA: Insufficient documentation

## 2022-04-24 LAB — CBC WITH DIFFERENTIAL/PLATELET
Abs Immature Granulocytes: 0.01 10*3/uL (ref 0.00–0.07)
Basophils Absolute: 0 10*3/uL (ref 0.0–0.1)
Basophils Relative: 1 %
Eosinophils Absolute: 0 10*3/uL (ref 0.0–0.5)
Eosinophils Relative: 0 %
HCT: 42.6 % (ref 39.0–52.0)
Hemoglobin: 15.6 g/dL (ref 13.0–17.0)
Immature Granulocytes: 0 %
Lymphocytes Relative: 25 %
Lymphs Abs: 1.4 10*3/uL (ref 0.7–4.0)
MCH: 31.9 pg (ref 26.0–34.0)
MCHC: 36.6 g/dL — ABNORMAL HIGH (ref 30.0–36.0)
MCV: 87.1 fL (ref 80.0–100.0)
Monocytes Absolute: 0.6 10*3/uL (ref 0.1–1.0)
Monocytes Relative: 12 %
Neutro Abs: 3.3 10*3/uL (ref 1.7–7.7)
Neutrophils Relative %: 62 %
Platelets: 291 10*3/uL (ref 150–400)
RBC: 4.89 MIL/uL (ref 4.22–5.81)
RDW: 12.1 % (ref 11.5–15.5)
WBC: 5.4 10*3/uL (ref 4.0–10.5)
nRBC: 0 % (ref 0.0–0.2)

## 2022-04-24 LAB — COMPREHENSIVE METABOLIC PANEL
ALT: 46 U/L — ABNORMAL HIGH (ref 0–44)
AST: 31 U/L (ref 15–41)
Albumin: 3.5 g/dL (ref 3.5–5.0)
Alkaline Phosphatase: 44 U/L (ref 38–126)
Anion gap: 14 (ref 5–15)
BUN: 6 mg/dL (ref 6–20)
CO2: 20 mmol/L — ABNORMAL LOW (ref 22–32)
Calcium: 8.7 mg/dL — ABNORMAL LOW (ref 8.9–10.3)
Chloride: 106 mmol/L (ref 98–111)
Creatinine, Ser: 0.93 mg/dL (ref 0.61–1.24)
GFR, Estimated: >60 mL/min (ref >60)
Glucose, Bld: 84 mg/dL (ref 70–99)
Potassium: 3.8 mmol/L (ref 3.5–5.1)
Sodium: 140 mmol/L (ref 135–145)
Total Bilirubin: 1.2 mg/dL (ref 0.3–1.2)
Total Protein: 6.7 g/dL (ref 6.5–8.1)

## 2022-04-24 LAB — LIPASE, BLOOD: Lipase: 34 U/L (ref 11–51)

## 2022-04-24 MED ORDER — SODIUM CHLORIDE 0.9 % IV BOLUS
1000.0000 mL | Freq: Once | INTRAVENOUS | Status: AC
  Administered 2022-04-24: 1000 mL via INTRAVENOUS

## 2022-04-24 MED ORDER — DROPERIDOL 2.5 MG/ML IJ SOLN
1.2500 mg | Freq: Once | INTRAMUSCULAR | Status: AC
  Administered 2022-04-24: 1.25 mg via INTRAVENOUS
  Filled 2022-04-24: qty 2

## 2022-04-24 MED ORDER — ONDANSETRON HCL 4 MG/2ML IJ SOLN
4.0000 mg | Freq: Once | INTRAMUSCULAR | Status: AC
  Administered 2022-04-24: 4 mg via INTRAVENOUS
  Filled 2022-04-24: qty 2

## 2022-04-24 MED ORDER — DICYCLOMINE HCL 20 MG PO TABS: 20.0000 mg | ORAL_TABLET | Freq: Two times a day (BID) | ORAL | 0 refills | Status: DC

## 2022-04-24 MED ORDER — ONDANSETRON HCL 4 MG PO TABS: 4.0000 mg | ORAL_TABLET | Freq: Three times a day (TID) | ORAL | 0 refills | Status: DC | PRN

## 2022-04-24 NOTE — ED Notes (Signed)
Patient verbalizes understanding of d/c instructions. Opportunities for questions and answers were provided. Pt d/c from ED and ambulated to lobby with sister.

## 2022-04-24 NOTE — ED Provider Notes (Signed)
[facility] EMERGENCY DEPARTMENT Provider Note   CSN: [id number] Arrival date & time: 04/24/22  1919     History  Chief Complaint  Patient presents with   Abdominal Pain   Nausea   Emesis   Shortness of Breath    Christopher Richardson is a 26 y.o. male.  The history is provided by the patient and medical records. No language interpreter was used.  Abdominal Pain Associated symptoms: shortness of breath and vomiting   Emesis Associated symptoms: abdominal pain   Shortness of Breath Associated symptoms: abdominal pain and vomiting     26 year old male brought here via EMS from home with complaints of abdominal pain and associate nausea and vomiting.  Patient report he has had recurrent abdominal discomfort in which describes a sharp cramping sensation with associated nausea and vomiting for the past 3 weeks.  Symptoms moderate in severity.  No fever but he does endorse some shortness of breath.  States he has decrease in bowel movement because he cannot keep anything down.  He mentioned he has been seen at least 5 times for his complaint and his GI referral is not until August.  He is frustrated due to his current symptoms that is not being treated appropriately.  Does have history of marijuana use but states he has not used marijuana or alcohol for at least 3 weeks.  Home Medications Prior to Admission medications   Medication Sig Start Date End Date Taking? Authorizing Provider  famotidine (PEPCID) 20 MG tablet Take 1 tablet (20 mg total) by mouth 2 (two) times daily. 04/08/22   Terrilee Files, MD  metoCLOPramide (REGLAN) 10 MG tablet Take 1 tablet (10 mg total) by mouth every 6 (six) hours. 04/22/22   Roemhildt, Lorin T, PA-C  ondansetron (ZOFRAN) 4 MG tablet Take 1 tablet (4 mg total) by mouth every 8 (eight) hours as needed for nausea or vomiting. 04/15/22   Cheryll Cockayne, MD  potassium chloride SA (KLOR-CON M) 20 MEQ tablet Take 1 tablet (20 mEq total) by mouth 2  (two) times daily for 3 days. 04/22/22 04/25/22  Roemhildt, Lorin T, PA-C      Allergies    Patient has no known allergies.    Review of Systems   Review of Systems  Respiratory:  Positive for shortness of breath.   Gastrointestinal:  Positive for abdominal pain and vomiting.  All other systems reviewed and are negative.   Physical Exam Updated Vital Signs BP 116/88 (BP Location: Right Arm)   Pulse 81   Temp 99 F (37.2 C) (Oral)   Resp 16   Ht 5\' 11"  (1.803 m)   Wt 95.7 kg   SpO2 100%   BMI 29.43 kg/m  Physical Exam Vitals and nursing note reviewed.  Constitutional:      General: He is not in acute distress.    Appearance: He is well-developed.  HENT:     Head: Atraumatic.  Eyes:     Conjunctiva/sclera: Conjunctivae normal.  Cardiovascular:     Rate and Rhythm: Normal rate and regular rhythm.  Pulmonary:     Effort: Pulmonary effort is normal.     Breath sounds: Normal breath sounds.  Abdominal:     General: Abdomen is flat.     Palpations: Abdomen is soft.     Tenderness: There is no abdominal tenderness. There is no guarding or rebound.  Musculoskeletal:     Cervical back: Neck supple.  Skin:    Findings: No rash.  Neurological:     Mental Status: He is alert.     ED Results / Procedures / Treatments   Labs (all labs ordered are listed, but only abnormal results are displayed) Labs Reviewed  CBC WITH DIFFERENTIAL/PLATELET - Abnormal; Notable for the following components:      Result Value   MCHC 36.6 (*)    All other components within normal limits  COMPREHENSIVE METABOLIC PANEL - Abnormal; Notable for the following components:   CO2 20 (*)    Calcium 8.7 (*)    ALT 46 (*)    All other components within normal limits  LIPASE, BLOOD  URINALYSIS, ROUTINE W REFLEX MICROSCOPIC  RAPID URINE DRUG SCREEN, HOSP PERFORMED    EKG None  Radiology US Abdomen Limited  Result Date: 04/24/2022 CLINICAL DATA:  Nausea and vomiting. EXAM: ULTRASOUND ABDOMEN  LIMITED RIGHT UPPER QUADRANT COMPARISON:  05/08/2021 CT abdomen and pelvis FINDINGS: Gallbladder: No gallstones or wall thickening visualized. No sonographic Murphy sign noted by sonographer. Common bile duct: Diameter: 2.7 mm Liver: No focal lesion identified. Within normal limits in parenchymal echogenicity. Portal vein is patent on color Doppler imaging with normal direction of blood flow towards the liver. Other: None. IMPRESSION: Normal right upper quadrant ultrasound. Electronically Signed   By: Darliss Cheney M.D.   On: 04/24/2022 23:16    Procedures Procedures    Medications Ordered in ED Medications  sodium chloride 0.9 % bolus 1,000 mL (has no administration in time range)  ondansetron (ZOFRAN) injection 4 mg (has no administration in time range)  droperidol (INAPSINE) 2.5 MG/ML injection 1.25 mg (has no administration in time range)    ED Course/ Medical Decision Making/ A&P                           Medical Decision Making Amount and/or Complexity of Data Reviewed Labs: ordered. Radiology: ordered.  Risk Prescription drug management.   BP 116/88 (BP Location: Right Arm)   Pulse 81   Temp 99 F (37.2 C) (Oral)   Resp 16   Ht 5\' 11"  (1.803 m)   Wt 95.7 kg   SpO2 100%   BMI 29.43 kg/m   3:24 PM 26 year old male presenting with complaints of abdominal pain associate nausea vomiting ongoing for approximate 3 weeks.  He has been seen evaluate in the ED for the same complaint multiple times in the past.  He has had positive marijuana in his system on prior UDS.  I suspect his symptoms could be related to cannabinoid hyperemesis syndrome.  However patient denies recent marijuana use and states last use was more than 3 weeks ago.  He endorsed abdominal pain however on exam abdomen is soft nontender.  Patient is afebrile, vital signs stable.  He does spit up in the room.  Will check labs and will provide symptomatic treatment.  EKG obtained without any signs of prolonged QT,  will give droperidol, IV fluid, and Zofran.  11:29 PM Labs and imaging obtained independently viewed interpreted by me and agree with radiologist interpretation.  Patient without any electrolyte derangement, normal WBC, normal H&H, ultrasound of the abdomen without any acute finding.  EKG without concerning arrhythmia or prolonged QT.  At this time a strong encourage patient to follow-up with primary care provider or with GI specialist for outpatient management of his condition.  This patient presents to the ED for concern of abd pain, this involves an extensive number of treatment options, and is a  complaint that carries with it a high risk of complications and morbidity.  The differential diagnosis includes cannabinoid hyperemesis syndrome, gastritis, cholecystitis, colitis, appendicitis, pancreatitis  Co morbidities that complicate the patient evaluation marijuana use Additional history obtained:  Additional history obtained from sister External records from outside source obtained and reviewed including EMR, including labs and imaging  Lab Tests:  I Ordered, and personally interpreted labs.  The pertinent results include:  as above  Imaging Studies ordered:  I ordered imaging studies including limited abdominal US I independently visualized and interpreted imaging which showed no acute finding I agree with the radiologist interpretation  Cardiac Monitoring:  The patient was maintained on a cardiac monitor.  I personally viewed and interpreted the cardiac monitored which showed an underlying rhythm of: NSR  Medicines ordered and prescription drug management:  I ordered medication including droperidol  for abd pain Reevaluation of the patient after these medicines showed that the patient improved I have reviewed the patients home medicines and have made adjustments as needed  Test Considered: as above  Critical Interventions: IVF  antiemetic   Problem List / ED  Course: abd pain  Nausea & Vomiting  Reevaluation:  After the interventions noted above, I reevaluated the patient and found that they have :improved  Social Determinants of Health: tobacco use, recommend marijuana cessation   Dispostion:  After consideration of the diagnostic results and the patients response to treatment, I feel that the patent would benefit from outpt f/u.         Final Clinical Impression(s) / ED Diagnoses Final diagnoses:  Nausea and vomiting, unspecified vomiting type    Rx / DC Orders ED Discharge Orders          Ordered    ondansetron (ZOFRAN) 4 MG tablet  Every 8 hours PRN        04/24/22 2327    dicyclomine (BENTYL) 20 MG tablet  2 times daily        04/24/22 2327              Fayrene Helper, PA-C 04/24/22 2332    Ernie Avena, MD 04/24/22 2350

## 2022-04-24 NOTE — ED Triage Notes (Signed)
Pt BIB [facility] from home c/o abdominal pain that radiates to chest, N/V, SHOB, and cramping. Pt states decreases in intake and output x3 weeks. Pt on O2 for comport. VSS, A&Ox4

## 2022-04-24 NOTE — Discharge Instructions (Signed)
You have been evaluated for your symptoms.  Fortunately ultrasound of your gallbladder did not show any signs of infection.  Your labs are reassuring.  At this time, please follow-up with your primary care doctor or with GI specialist for further assessment of your symptoms.  Please keep in mind not to use marijuana as it may worsen your symptoms.

## 2022-04-25 ENCOUNTER — Ambulatory Visit: Payer: Self-pay | Admitting: *Deleted

## 2022-04-25 NOTE — Telephone Encounter (Signed)
Reason for Disposition  [1] MILD or MODERATE vomiting AND [2] present > 48 hours (2 days) (Exception: mild vomiting with associated diarrhea)  Answer Assessment - Initial Assessment Questions 1. VOMITING SEVERITY: "How many times have you vomited in the past 24 hours?"     - MILD:  1 - 2 times/day    - MODERATE: 3 - 5 times/day, decreased oral intake without significant weight loss or symptoms of dehydration    - SEVERE: 6 or more times/day, vomits everything or nearly everything, with significant weight loss, symptoms of dehydration      Moderate- feels faint- and has fainted twice 2. ONSET: "When did the vomiting begin?"      3 weeks- has been to ED-5 times 3. FLUIDS: "What fluids or food have you vomited up today?" "Have you been able to keep any fluids down?"     Not keeping fluids down 4. ABDOMINAL PAIN: "Are your having any abdominal pain?" If yes : "How bad is it and what does it feel like?" (e.g., crampy, dull, intermittent, constant)      Yes- cramping 5. DIARRHEA: "Is there any diarrhea?" If Yes, ask: "How many times today?"      Constipated- bowels not moving 6. CONTACTS: "Is there anyone else in the family with the same symptoms?"      no 7. CAUSE: "What do you think is causing your vomiting?"     unsure 8. HYDRATION STATUS: "Any signs of dehydration?" (e.g., dry mouth [not only dry lips], too weak to stand) "When did you last urinate?"     Yes- feels faint, weakness,last night- urinated 9. OTHER SYMPTOMS: "Do you have any other symptoms?" (e.g., fever, headache, vertigo, vomiting blood or coffee grounds, recent head injury)     no 10. PREGNANCY: "Is there any chance you are pregnant?" "When was your last menstrual period?"  Protocols used: Vomiting-A-AH

## 2022-04-25 NOTE — Telephone Encounter (Signed)
Patient sister is calling:  Chief Complaint: vomiting  Symptoms: requesting sooner appointment/cancellation list Frequency: 3 weeks- multiple ED visits Pertinent Negatives: Sister denies dehydration now Disposition: [] ED /[x] Urgent Care (no appt availability in office) / [] Appointment(In office/virtual)/ []  [facility]/ [] Home Care/ [] Refused Recommended Disposition /[] [facility]/ []  Follow-up with PCP Additional Notes: Patient has NP/hospital f/u appointment- advised UC until patient can be seen in office- added to cancellation list also

## 2022-04-28 ENCOUNTER — Ambulatory Visit: Payer: Self-pay

## 2022-04-28 ENCOUNTER — Encounter (HOSPITAL_COMMUNITY): Payer: Self-pay

## 2022-04-28 ENCOUNTER — Ambulatory Visit (HOSPITAL_COMMUNITY)
Admission: EM | Admit: 2022-04-28 | Discharge: 2022-04-28 | Disposition: A | Discharge status: Home / Self Care | Payer: Self-pay | Attending: Emergency Medicine | Admitting: Emergency Medicine

## 2022-04-28 DIAGNOSIS — R1084 Generalized abdominal pain: Secondary | ICD-10-CM

## 2022-04-28 DIAGNOSIS — R112 Nausea with vomiting, unspecified: Secondary | ICD-10-CM

## 2022-04-28 MED ORDER — LIDOCAINE VISCOUS HCL 2 % MT SOLN
15.0000 mL | Freq: Once | OROMUCOSAL | Status: AC
Start: 2022-04-28 — End: 2022-04-28
  Administered 2022-04-28: 15 mL via ORAL

## 2022-04-28 MED ORDER — ALUM & MAG HYDROXIDE-SIMETH 200-200-20 MG/5ML PO SUSP
30.0000 mL | Freq: Once | ORAL | Status: AC
Start: 2022-04-28 — End: 2022-04-28
  Administered 2022-04-28: 30 mL via ORAL

## 2022-04-28 MED ORDER — ONDANSETRON HCL 4 MG/2ML IJ SOLN
INTRAMUSCULAR | Status: AC
  Filled 2022-04-28: qty 2

## 2022-04-28 MED ORDER — ONDANSETRON 4 MG PO TBDP: 4.0000 mg | ORAL_TABLET | Freq: Four times a day (QID) | ORAL | 2 refills | Status: DC | PRN

## 2022-04-28 MED ORDER — ONDANSETRON HCL 4 MG/2ML IJ SOLN
4.0000 mg | Freq: Once | INTRAMUSCULAR | Status: AC
Start: 2022-04-28 — End: 2022-04-28
  Administered 2022-04-28: 4 mg via INTRAMUSCULAR

## 2022-04-28 MED ORDER — FAMOTIDINE 20 MG PO TABS: 20.0000 mg | ORAL_TABLET | Freq: Two times a day (BID) | ORAL | 2 refills | Status: DC

## 2022-04-28 MED ORDER — LIDOCAINE VISCOUS HCL 2 % MT SOLN
OROMUCOSAL | Status: AC
  Filled 2022-04-28: qty 15

## 2022-04-28 MED ORDER — ALUM & MAG HYDROXIDE-SIMETH 200-200-20 MG/5ML PO SUSP
ORAL | Status: AC
  Filled 2022-04-28: qty 30

## 2022-04-28 MED ORDER — KETOROLAC TROMETHAMINE 30 MG/ML IJ SOLN
INTRAMUSCULAR | Status: AC
  Filled 2022-04-28: qty 1

## 2022-04-28 MED ORDER — KETOROLAC TROMETHAMINE 60 MG/2ML IM SOLN
30.0000 mg | Freq: Once | INTRAMUSCULAR | Status: AC
  Administered 2022-04-28: 30 mg via INTRAMUSCULAR

## 2022-04-28 MED ORDER — MYLANTA COAT & COOL 1200-270-80 MG/10ML PO SUSP: 30.0000 mL | Freq: Two times a day (BID) | ORAL | 0 refills | Status: DC

## 2022-04-28 NOTE — Discharge Instructions (Addendum)
Continue the pepcid twice daily - this can help with reflux symptoms.  You can take the zofran every 6 hours as needed. This medicine is a pill that dissolves under the tongue to help with nausea.  You can also take the Bentyl that was prescribed in the ED - this medicine helps by relaxing the muscles in the belly to help with pain and cramping.  Try the Mylanta (the medicine I gave you in clinic today) twice daily. About 30 mL each dose. This can help with settling the stomach as well.  Please go to the emergency department if symptoms worsen, or if medicine does not control your nausea and vomiting.

## 2022-04-28 NOTE — ED Triage Notes (Signed)
Pt presents to [facility] for severe vomiting and nausea since 04/08/2022.Pt states he was given medication to help with his symptoms and would like a refill. He reports not been able to keep anything down but ice chips.

## 2022-04-28 NOTE — ED Provider Notes (Signed)
[facility]    CSN: [id number] Arrival date & time: 04/28/22  1006      History   Chief Complaint Chief Complaint  Patient presents with   Emesis   Nausea    HPI Christopher Richardson is a 26 y.o. male.  Presents with 3-week history of nausea and vomiting with abdominal pain.  He has been seen in the emergency department 4 times this month for the same concerns.  Reports he had this problem last year as well.  This episode began after drinking alcohol, emergency department visit on 6/26, was given GI cocktail of Pepcid, Reglan, droperidol with improvement of symptoms.  He also received IV fluids at that time. Presented again 7/3 with continued vomiting.  At that time hypokalemia noted on CMP, otherwise work-up negative.  Treated with oral potassium, fluids, Zofran. Returned 7/10, still hypokalemic, treated with same. Most recent visit 7/12, right upper quadrant ultrasound negative.  At that time was prescribed Bentyl and given more fluids.  Electrolytes normal at that time.  Otherwise negative work-up again.  He has been given outpatient GI referral but is not until August.  History of marijuana use but has not used since symptoms first began. Reports the Zofran and Pepcid at home will help his symptoms but then later will return.  Very little food by mouth. Able to keep down some applesauce. Reports he can drink some water and ice chips.   No fevers, congestion, cough, shortness of breath, chest pain, rash. No history of abdominal surgeries. Decreased stool output due to decreased food intake.  Partner bedside reports some improvement over the last week.  History reviewed. No pertinent past medical history.  There are no problems to display for this patient.  History reviewed. No pertinent surgical history.  Home Medications    Prior to Admission medications   Medication Sig Start Date End Date Taking? Authorizing Provider  Cal Carb-Mag Hydrox-Simeth (MYLANTA COAT &  COOL) 1200-270-80 MG/10ML SUSP Take 30 mLs by mouth in the morning and at bedtime. 04/28/22  Yes Ossiel Marchio, Lurena Joiner, PA-C  dicyclomine (BENTYL) 20 MG tablet Take 1 tablet (20 mg total) by mouth 2 (two) times daily. 04/24/22   Fayrene Helper, PA-C  famotidine (PEPCID) 20 MG tablet Take 1 tablet (20 mg total) by mouth 2 (two) times daily. 04/28/22   Arish Redner, Lurena Joiner, PA-C  metoCLOPramide (REGLAN) 10 MG tablet Take 1 tablet (10 mg total) by mouth every 6 (six) hours. 04/22/22   Roemhildt, Lorin T, PA-C  ondansetron (ZOFRAN-ODT) 4 MG disintegrating tablet Take 1 tablet (4 mg total) by mouth every 6 (six) hours as needed for nausea or vomiting. 04/28/22   Derk Doubek, Lurena Joiner, PA-C  potassium chloride SA (KLOR-CON M) 20 MEQ tablet Take 1 tablet (20 mEq total) by mouth 2 (two) times daily for 3 days. 04/22/22 04/25/22  Roemhildt, Lora Paula, PA-C    Family History History reviewed. No pertinent family history.  Social History Social History   Tobacco Use   Smoking status: Every Day    Packs/day: 0.50    Types: Cigarettes   Smokeless tobacco: Never  Vaping Use   Vaping Use: Never used  Substance Use Topics   Alcohol use: Yes    Comment: occassional   Drug use: Yes    Types: Marijuana     Allergies   Patient has no known allergies.   Review of Systems Review of Systems  Gastrointestinal:  Positive for vomiting.   Per HPI  Physical Exam Triage Vital Signs  ED Triage Vitals [04/28/22 1021]  Enc Vitals Group     BP (!) 100/45     Pulse Rate 86     Resp 18     Temp      Temp src      SpO2 98 %     Weight      Height      Head Circumference      Peak Flow      Pain Score      Pain Loc      Pain Edu?      Excl. in GC?    No data found.  Updated Vital Signs BP (!) 100/45 (BP Location: Left Arm)   Pulse 86   Resp 18   SpO2 98%    Physical Exam Vitals and nursing note reviewed.  Constitutional:      General: He is not in acute distress.    Appearance: He is ill-appearing.  HENT:      Mouth/Throat:     Mouth: Mucous membranes are moist.     Pharynx: Oropharynx is clear.     Comments: Moist membranes Eyes:     Conjunctiva/sclera: Conjunctivae normal.  Cardiovascular:     Rate and Rhythm: Normal rate and regular rhythm.     Pulses: Normal pulses.     Heart sounds: Normal heart sounds.  Pulmonary:     Effort: Pulmonary effort is normal. No respiratory distress.     Breath sounds: Normal breath sounds.  Abdominal:     General: Bowel sounds are normal.     Palpations: Abdomen is soft.     Tenderness: There is abdominal tenderness in the epigastric area. There is no guarding or rebound. Negative signs include Murphy's sign.  Skin:    General: Skin is warm and dry.  Neurological:     Mental Status: He is alert and oriented to person, place, and time.  Psychiatric:        Behavior: Behavior is slowed.     UC Treatments / Results  Labs (all labs ordered are listed, but only abnormal results are displayed) Labs Reviewed - No data to display  EKG  Radiology No results found.  Procedures Procedures   Medications Ordered in UC Medications  ondansetron (ZOFRAN) injection 4 mg (4 mg Intramuscular Given 04/28/22 1046)  alum & mag hydroxide-simeth (MAALOX/MYLANTA) 200-200-20 MG/5ML suspension 30 mL (30 mLs Oral Given 04/28/22 1047)    And  lidocaine (XYLOCAINE) 2 % viscous mouth solution 15 mL (15 mLs Oral Given 04/28/22 1047)  ketorolac (TORADOL) injection 30 mg (30 mg Intramuscular Given 04/28/22 1049)    Initial Impression / Assessment and Plan / UC Course  I have reviewed the triage vital signs and the nursing notes.  Pertinent labs & imaging results that were available during my care of the patient were reviewed by me and considered in my medical decision making (see chart for details).  Zofran and Toradol injection, GI cocktail of Mylanta with lidocaine. Patient reports much improvement in symptoms, he is looking more alert as well.  Most recent lab work  7/12 in the ED was unremarkable. He received lots of fluids and does not appear dehydrated. Chewing ice chips in clinic.  He is requesting refill of Zofran and Pepcid. We will try ODT zofran, 30 min before trying bland food. Continue fluids as much as tolerated. Bentyl and pepcid as well. Can try Mylanta at home as this helped him today. We had a long discussion about what  medications can help with what, how to take them, and bland foods to try. Also discussed recent lab work from prior visits.  Strict ED precautions. If symptoms persist may need further imaging with CT scan or possible admission with GI consult.  Patient agrees to plan, discharged in stable condition.  Final Clinical Impressions(s) / UC Diagnoses   Final diagnoses:  Nausea and vomiting, unspecified vomiting type  Generalized abdominal pain     Discharge Instructions      Continue the pepcid twice daily - this can help with reflux symptoms.  You can take the zofran every 6 hours as needed. This medicine is a pill that dissolves under the tongue to help with nausea.  You can also take the Bentyl that was prescribed in the ED - this medicine helps by relaxing the muscles in the belly to help with pain and cramping.  Try the Mylanta (the medicine I gave you in clinic today) twice daily. About 30 mL each dose. This can help with settling the stomach as well.  Please go to the emergency department if symptoms worsen, or if medicine does not control your nausea and vomiting.     ED Prescriptions     Medication Sig Dispense Auth. Provider   famotidine (PEPCID) 20 MG tablet  (Status: Discontinued) Take 1 tablet (20 mg total) by mouth 2 (two) times daily. 30 tablet Meesha Sek, PA-C   ondansetron (ZOFRAN-ODT) 4 MG disintegrating tablet  (Status: Discontinued) Take 1 tablet (4 mg total) by mouth every 6 (six) hours as needed for nausea or vomiting. 20 tablet Lesly Joslyn, PA-C   Cal Carb-Mag Hydrox-Simeth (MYLANTA  COAT & COOL) 1200-270-80 MG/10ML SUSP Take 30 mLs by mouth in the morning and at bedtime. 335 mL Mitchell Epling, PA-C   famotidine (PEPCID) 20 MG tablet Take 1 tablet (20 mg total) by mouth 2 (two) times daily. 30 tablet Asar Evilsizer, PA-C   ondansetron (ZOFRAN-ODT) 4 MG disintegrating tablet Take 1 tablet (4 mg total) by mouth every 6 (six) hours as needed for nausea or vomiting. 20 tablet Eulogia Dismore, Wells Guiles, PA-C      PDMP not reviewed this encounter.   Lamark Schue, Wells Guiles, [place] 04/28/22 1142

## 2022-04-30 ENCOUNTER — Encounter: Payer: Self-pay | Admitting: Internal Medicine

## 2022-05-13 ENCOUNTER — Inpatient Hospital Stay: Payer: Self-pay | Admitting: Internal Medicine

## 2022-05-21 NOTE — Progress Notes (Unsigned)
05/22/2022 Nevaan Bunton [id number] [birth year]  Referring provider: No ref. provider found Primary GI doctor: Dr. Tarri Glenn  ASSESSMENT AND PLAN:   Nausea and vomiting, unspecified vomiting type with GERD 2 episodes of nausea and vomiting after heavy ETOH use with GERD, no symptoms in between, patient has stopped ETOH, continues marijuana use, no NSAIDS He is getting insurance with new job and prefers minimal work up at this time which I think is okay since paient is not having any symptoms.  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  Can do lidocaine patches for the pain such as salon pas patches.   Can do trial of amitriptyline for nausea if needed.  Close follow up, will call if any symptoms Will start the patient on a PPI Lifestyle changes discussed, avoid NSAIDS, ETOH -     CBC with Differential/Platelet; Future -     Comprehensive metabolic panel; Future -     H. pylori antibody, IgG; Future -     pantoprazole (PROTONIX) 40 MG tablet; Take 1 tablet (40 mg total) by mouth daily.   Patient Care Team: Patient, No Pcp Per as PCP - General (General Practice)  HISTORY OF PRESENT ILLNESS: 26 y.o. male  presents for evaluation of vomiting.   Reviewed patient's records, has had ER visits going as far back as 2020 for nausea and vomiting.  Within the last year has had 7 ER visits for nausea and vomiting. 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   05/08/2021 CT abdomen pelvis with contrast for abdominal pain, vomiting completely unremarkable 04/24/2022 abdominal ultrasound for nausea and vomiting shows no gallstones, no biliary dilatation, normal liver.  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 Patient's had repeated negative lipase. ALT has been slightly elevated at 46, normal alk phos, normal AST, unremarkable bilirubin.  No anemia no leukocytosis. Patient has had metabolic derangements with hypokalemia and hypocalcemia, slightly elevated total protein.  Has had vomiting x 1 years.  Has had 2 episodes July 2022 went to ER x 2 and then this July 2023 6 times to the ER due to vomiting.  He has had normal CT AB and pelvis and normal AB [place].  Before both episodes had night of drinking heavily and then had irretractable nausea and vomiting.  Inbetween the two episodes, no nausea, vomiting.  No AB between episodes.  Has BM 2-3 x a day, no straining, not loose, well formed.  Does not look, no melena, no hematochezia.  Occ GERD,was worse but normally if he eats before he lays down or spicy foods.  He has since stopped drinking since last episode, would have episodes of heavy drinking prior for a week and then weeks without drinking, rare beer. Hot showers can make the symptoms better or worse, has had both.  No family history of GI malignancy. No family issue of GB.  No history of dysphagia.     Current Medications:        Current Outpatient Medications (Other):    pantoprazole (PROTONIX) 40 MG tablet, Take 1 tablet (40 mg total) by mouth daily.  Medical History: No past medical history on file. Allergies: No Known Allergies   Surgical History:  He  has no past surgical history on file. Family History:  His family history is not on file. Social History:   reports that he has been smoking cigarettes. He has been smoking an average of .5 packs per day. He has never used smokeless tobacco. He reports current alcohol use. He reports current drug use. Drug: Marijuana.  REVIEW OF SYSTEMS  : All other systems reviewed and negative except where noted in the History of Present Illness.   PHYSICAL EXAM: BP 106/62   Pulse 74   Ht $R'5\' 11"'Ig$  (1.803 m)    Wt 226 lb (102.5 kg)   BMI 31.52 kg/m  General:   Pleasant, well developed male in no acute distress Head:   Normocephalic and atraumatic. Eyes:  sclerae anicteric,conjunctive pink  Heart:   regular rate and rhythm Pulm:  Clear anteriorly; no wheezing Abdomen:   Soft, Obese AB, Active bowel sounds. No tenderness . Without guarding and Without rebound, No organomegaly appreciated. Rectal: Not evaluated Extremities:  Without edema. Msk: Symmetrical without gross deformities. Peripheral pulses intact.  Neurologic:  Alert and  oriented x4;  No focal deficits.  Skin:   Dry and intact without significant lesions or rashes. Psychiatric:  Cooperative. Normal mood and affect.    Vladimir Crofts, PA-C 3:11 PM

## 2022-05-22 ENCOUNTER — Ambulatory Visit (INDEPENDENT_AMBULATORY_CARE_PROVIDER_SITE_OTHER): Payer: Self-pay | Admitting: Physician Assistant

## 2022-05-22 ENCOUNTER — Encounter: Payer: Self-pay | Admitting: Physician Assistant

## 2022-05-22 ENCOUNTER — Other Ambulatory Visit (INDEPENDENT_AMBULATORY_CARE_PROVIDER_SITE_OTHER): Payer: Self-pay

## 2022-05-22 VITALS — BP 106/62 | HR 74 | Ht 71.0 in | Wt 226.0 lb

## 2022-05-22 DIAGNOSIS — R112 Nausea with vomiting, unspecified: Secondary | ICD-10-CM

## 2022-05-22 DIAGNOSIS — K219 Gastro-esophageal reflux disease without esophagitis: Secondary | ICD-10-CM

## 2022-05-22 LAB — COMPREHENSIVE METABOLIC PANEL
ALT: 48 U/L (ref 0–53)
AST: 22 U/L (ref 0–37)
Albumin: 4.1 g/dL (ref 3.5–5.2)
Alkaline Phosphatase: 71 U/L (ref 39–117)
BUN: 6 mg/dL (ref 6–23)
CO2: 28 mEq/L (ref 19–32)
Calcium: 9.1 mg/dL (ref 8.4–10.5)
Chloride: 105 mEq/L (ref 96–112)
Creatinine, Ser: 0.74 mg/dL (ref 0.40–1.50)
GFR: 123.79 mL/min (ref >60.00)
Glucose, Bld: 90 mg/dL (ref 70–99)
Potassium: 4.5 mEq/L (ref 3.5–5.1)
Sodium: 141 mEq/L (ref 135–145)
Total Bilirubin: 0.4 mg/dL (ref 0.2–1.2)
Total Protein: 7.5 g/dL (ref 6.0–8.3)

## 2022-05-22 LAB — CBC WITH DIFFERENTIAL/PLATELET
Basophils Absolute: 0 10*3/uL (ref 0.0–0.1)
Basophils Relative: 0.8 % (ref 0.0–3.0)
Eosinophils Absolute: 0.1 10*3/uL (ref 0.0–0.7)
Eosinophils Relative: 1.6 % (ref 0.0–5.0)
HCT: 39.3 % (ref 39.0–52.0)
Hemoglobin: 13 g/dL (ref 13.0–17.0)
Lymphocytes Relative: 28.3 % (ref 12.0–46.0)
Lymphs Abs: 1.5 10*3/uL (ref 0.7–4.0)
MCHC: 33.1 g/dL (ref 30.0–36.0)
MCV: 93.9 fl (ref 78.0–100.0)
Monocytes Absolute: 0.5 10*3/uL (ref 0.1–1.0)
Monocytes Relative: 9.5 % (ref 3.0–12.0)
Neutro Abs: 3.1 10*3/uL (ref 1.4–7.7)
Neutrophils Relative %: 59.8 % (ref 43.0–77.0)
Platelets: 237 10*3/uL (ref 150.0–400.0)
RBC: 4.19 Mil/uL — ABNORMAL LOW (ref 4.22–5.81)
RDW: 14.6 % (ref 11.5–15.5)
WBC: 5.2 10*3/uL (ref 4.0–10.5)

## 2022-05-22 LAB — H. PYLORI ANTIBODY, IGG: H Pylori IgG: NEGATIVE

## 2022-05-22 MED ORDER — PANTOPRAZOLE SODIUM 40 MG PO TBEC: 40.0000 mg | DELAYED_RELEASE_TABLET | Freq: Every day | ORAL | 3 refills | Status: AC

## 2022-05-22 NOTE — Patient Instructions (Addendum)
Your provider has requested that you go to the basement level for lab work before leaving today. Press "B" on the elevator. The lab is located at the first door on the left as you exit the elevator.   Due to recent changes in healthcare laws, you may see the results of your imaging and laboratory studies on MyChart before your provider has had a chance to review them.  We understand that in some cases there may be results that are confusing or concerning to you. Not all laboratory results come back in the same time frame and the provider may be waiting for multiple results in order to interpret others.  Please give [place] 48 hours in order for your provider to thoroughly review all the results before contacting the office for clarification of your results.    Please take your proton pump inhibitor medication, Protonix 40 mg for 6-8 weeks, then can take prilosec over the counter as needed.   Please take this medication 30 minutes to 1 hour before meals- this makes it more effective.  Avoid spicy and acidic foods Avoid fatty foods Limit your intake of coffee, tea, alcohol, and carbonated drinks Work to maintain a healthy weight Keep the head of the bed elevated at least 3 inches with blocks or a wedge pillow if you are having any nighttime symptoms Stay upright for 2 hours after eating Avoid meals and snacks three to four hours before bedtime  ___________________________  Cannabinoid Hyperemesis Syndrome  What is cannabinoid hyperemesis syndrome?  Cannabinoid hyperemesis syndrome (CHS) is a condition that leads to repeated and severe bouts of vomiting. It is rare and only occurs in daily long-term users of marijuana.  Marijuana has several active substances. These include THC and related chemicals. These substances bind to molecules found in the brain. That causes the drug "high" and other effects that users feel.  Your digestive tract also has a number of molecules that bind to [facility] and related  substances. So marijuana also affects the digestive tract. For example, the drug can change the time it takes the stomach to empty. It also affects the esophageal sphincter. That's the tight band of muscle that opens and closes to let food from the esophagus into the stomach. Long-term marijuana use can change the way the affected molecules respond and lead to the symptoms of CHS.  Marijuana is the most widely used recreational drug in the [place] Young adults are the most frequent users. A small number of these people develop CHS. It often only happens in people who have regularly used marijuana. Often CHS affects those who use the drug at least once a day.  What causes cannabinoid hyperemesis syndrome?  Marijuana has very complex effects on the body. Experts are still trying to learn exactly how it causes CHS in some people.  In the brain, marijuana often has the opposite effect of CHS. It helps prevent nausea and vomiting. The drug is also good at stopping such symptoms in people having chemotherapy.  But in the digestive tract, marijuana seems to have the opposite effect. It actually makes you more likely to have nausea and vomiting. With the first use of marijuana, the signals from the brain may be more important. That may lead to anti-nausea effects at first. But with repeated use of marijuana, certain receptors in the brain may stop responding to the drug in the same way. That may cause the repeated bouts of vomiting found in people with CHS.  It still isn't clear  why some heavy marijuana users get the syndrome, but others don't.  What are the symptoms of cannabinoid hyperemesis syndrome?  People with CHS suffer from repeated bouts of vomiting. In between these episodes are times without any symptoms. Healthcare providers often divide these symptoms into 3 stages: the prodromal phase, the hyperemetic phase, and the recovery phase.  Prodromal phase. During this phase, the main symptoms are often  early morning nausea and belly (abdominal) pain. Some people also develop a fear of vomiting. Most people keep normal eating patterns during this time. Some people use more marijuana because they think it will help stop the nausea. This phase may last for months or years.  Hyperemetic phase. Symptoms during this time may include:  Ongoing nausea  Repeated episodes of vomiting  Belly pain  Decreased food intake and weight loss  Symptoms of fluid loss (dehydration)  During this phase, vomiting is often intense and overwhelming. Many people take a lot of hot showers during the day. They find that doing so eases their nausea. (That may be because of how the hot temperature affects a part of the brain called the hypothalamus. This part of the brain effects both temperature regulation and vomiting.) People often first seek medical care during this phase.  The hyperemetic phase may continue until the person completely stops using marijuana. Then the recovery phase starts.  Recovery phase. During this time, symptoms go away. Normal eating is possible again. This phase can last days or months. Symptoms often come back if the person tries marijuana again.  How is cannabinoid hyperemesis syndrome diagnosed?  Many health problems can cause repeated vomiting. To make a diagnosis, your healthcare provider will ask you about your symptoms and your past health. He or she will also do a physical exam, including an exam of your belly. Often this diagnosis can be made from history alone, however.  Your healthcare provider may also need more tests to rule out other causes of the vomiting.  CHS was only recently discovered. So some healthcare providers may not know about it. As a result, they may not spot it for many years. They often confuse CHS with cyclical vomiting disorder. That is a health problem that causes similar symptoms. A specialist trained in diseases of the digestive tract (gastroenterologist)  might make the diagnosis.  You may have CHS if you have all of these:  Long-term weekly and daily marijuana use  Belly pain  Severe, repeated nausea and vomiting  You feel better after taking a hot shower  There is no single test that confirms this diagnosis. Only improvement after quitting marijuana confirms the diagnosis.  How is cannabinoid hyperemesis syndrome treated?  To fully get better, you need to stop using marijuana all together. If you stop using marijuana, your symptoms should not come back.  Symptoms often ease after a day or 2 unless marijuana is used before this time.  In a small sample of people with CHS, rubbing capsaicin cream on the belly helped decrease pain and nausea. The chemicals in the cream have the same effect as a hot shower.  Quitting marijuana may lead to other health benefits, such as:  Better lung function  Improved memory and thinking skills  Better sleep  Key points about cannabinoid hyperemesis syndrome  CHS is a condition that leads to repeated and severe bouts of vomiting. It results from long-term use of marijuana.  Most people self-treat using hot showers to help reduce their symptoms.  Some people with CHS  may not be diagnosed for several years. Admitting to your healthcare provider that you use marijuana daily can speed up the diagnosis.  Symptoms start to go away within a day or 2 after stopping marijuana use.  Symptoms can come back if you use marijuana again  I appreciate the  opportunity to care for you  Thank You   [facility]

## 2022-05-25 NOTE — Progress Notes (Signed)
Reviewed.  Zeniyah Peaster L. Quinlan Vollmer, MD, MPH  

## 2022-07-17 ENCOUNTER — Ambulatory Visit: Payer: Self-pay | Admitting: Gastroenterology
